# Patient Record
Sex: Male | Born: 1955 | Race: White | Hispanic: No | Marital: Single | Smoking: Never smoker
Health system: Southern US, Community
[De-identification: ages and names within clinical notes are randomized; demographics above are authoritative.]

## PROBLEM LIST (undated history)

## (undated) DIAGNOSIS — T8859XA Other complications of anesthesia, initial encounter: Secondary | ICD-10-CM

## (undated) DIAGNOSIS — T4145XA Adverse effect of unspecified anesthetic, initial encounter: Secondary | ICD-10-CM

## (undated) DIAGNOSIS — M25511 Pain in right shoulder: Secondary | ICD-10-CM

## (undated) DIAGNOSIS — M25512 Pain in left shoulder: Secondary | ICD-10-CM

## (undated) HISTORY — PX: WISDOM TOOTH EXTRACTION: SHX21

## (undated) HISTORY — PX: HERNIA REPAIR: SHX51

## (undated) HISTORY — PX: COLONOSCOPY: SHX174

---

## 2000-09-15 ENCOUNTER — Encounter: Payer: Self-pay | Admitting: *Deleted

## 2000-09-16 ENCOUNTER — Inpatient Hospital Stay (HOSPITAL_COMMUNITY): Admission: RE | Admit: 2000-09-16 | Discharge: 2000-09-17 | Payer: Self-pay | Admitting: *Deleted

## 2014-05-09 ENCOUNTER — Encounter (INDEPENDENT_AMBULATORY_CARE_PROVIDER_SITE_OTHER): Payer: No Typology Code available for payment source | Admitting: Ophthalmology

## 2014-05-09 DIAGNOSIS — H2513 Age-related nuclear cataract, bilateral: Secondary | ICD-10-CM

## 2014-05-09 DIAGNOSIS — H43813 Vitreous degeneration, bilateral: Secondary | ICD-10-CM

## 2014-05-09 DIAGNOSIS — H33011 Retinal detachment with single break, right eye: Secondary | ICD-10-CM

## 2014-05-21 ENCOUNTER — Ambulatory Visit (INDEPENDENT_AMBULATORY_CARE_PROVIDER_SITE_OTHER): Payer: No Typology Code available for payment source | Admitting: Ophthalmology

## 2014-05-21 DIAGNOSIS — H33301 Unspecified retinal break, right eye: Secondary | ICD-10-CM

## 2014-09-19 ENCOUNTER — Ambulatory Visit (INDEPENDENT_AMBULATORY_CARE_PROVIDER_SITE_OTHER): Payer: No Typology Code available for payment source | Admitting: Ophthalmology

## 2015-03-21 ENCOUNTER — Encounter (INDEPENDENT_AMBULATORY_CARE_PROVIDER_SITE_OTHER): Payer: 59 | Admitting: Ophthalmology

## 2015-03-21 DIAGNOSIS — H33012 Retinal detachment with single break, left eye: Secondary | ICD-10-CM

## 2015-03-21 DIAGNOSIS — H33301 Unspecified retinal break, right eye: Secondary | ICD-10-CM

## 2015-03-21 DIAGNOSIS — H43813 Vitreous degeneration, bilateral: Secondary | ICD-10-CM

## 2015-03-21 DIAGNOSIS — H4312 Vitreous hemorrhage, left eye: Secondary | ICD-10-CM | POA: Diagnosis not present

## 2015-04-01 ENCOUNTER — Ambulatory Visit (INDEPENDENT_AMBULATORY_CARE_PROVIDER_SITE_OTHER): Payer: 59 | Admitting: Ophthalmology

## 2015-04-01 DIAGNOSIS — H33302 Unspecified retinal break, left eye: Secondary | ICD-10-CM

## 2015-06-20 ENCOUNTER — Ambulatory Visit (INDEPENDENT_AMBULATORY_CARE_PROVIDER_SITE_OTHER): Payer: 59 | Admitting: Ophthalmology

## 2015-06-20 DIAGNOSIS — H33303 Unspecified retinal break, bilateral: Secondary | ICD-10-CM

## 2015-06-20 DIAGNOSIS — H43813 Vitreous degeneration, bilateral: Secondary | ICD-10-CM | POA: Diagnosis not present

## 2015-08-01 ENCOUNTER — Ambulatory Visit (INDEPENDENT_AMBULATORY_CARE_PROVIDER_SITE_OTHER): Payer: 59 | Admitting: Ophthalmology

## 2016-04-10 ENCOUNTER — Encounter
Admission: RE | Admit: 2016-04-10 | Discharge: 2016-04-10 | Disposition: A | Discharge status: Home / Self Care | Payer: BLUE CROSS/BLUE SHIELD | Source: Ambulatory Visit | Attending: Surgery | Admitting: Surgery

## 2016-04-10 HISTORY — DX: Adverse effect of unspecified anesthetic, initial encounter: T41.45XA

## 2016-04-10 HISTORY — DX: Other complications of anesthesia, initial encounter: T88.59XA

## 2016-04-10 HISTORY — DX: Pain in left shoulder: M25.512

## 2016-04-10 HISTORY — DX: Pain in right shoulder: M25.511

## 2016-04-10 NOTE — Patient Instructions (Signed)
  Your procedure is scheduled on: 04-17-16 Report to Same Day Surgery 2nd floor medical mall To find out your arrival time please call [phone] between 1PM - 3PM on 04-16-16   Remember: Instructions that are not followed completely may result in serious medical risk, up to and including death, or upon the discretion of your surgeon and anesthesiologist your surgery may need to be rescheduled.    _x___ 1. Do not eat food or drink liquids after midnight. No gum chewing or hard candies.     __x__ 2. No Alcohol for 24 hours before or after surgery.   __x__3. No Smoking for 24 prior to surgery.   ____  4. Bring all medications with you on the day of surgery if instructed.    __x__ 5. Notify your doctor if there is any change in your medical condition     (cold, fever, infections).     Do not wear jewelry, make-up, hairpins, clips or nail polish.  Do not wear lotions, powders, or perfumes. You may wear deodorant.  Do not shave 48 hours prior to surgery. Men may shave face and neck.  Do not bring valuables to the hospital.    [facility] is not responsible for any belongings or valuables.               Contacts, dentures or bridgework may not be worn into surgery.  Leave your suitcase in the car. After surgery it may be brought to your room.  For patients admitted to the hospital, discharge time is determined by your treatment team.   Patients discharged the day of surgery will not be allowed to drive home.    Please read over the following fact sheets that you were given:   [facility] Preparing for Surgery and or MRSA Information   ____ Take these medicines the morning of surgery with A SIP OF WATER:    1. NONE  2.  3.  4.  5.  6.  ____Fleets enema or Magnesium Citrate as directed.   ____ Use CHG Soap or sage wipes as directed on instruction sheet   ____ Use inhalers on the day of surgery and bring to hospital day of surgery  ____ Stop metformin 2 days prior to  surgery    ____ Take 1/2 of usual insulin dose the night before surgery and none on the morning of surgery.   ____ Stop aspirin or coumadin, or plavix  x__ Stop Anti-inflammatories such as Advil, Aleve, Ibuprofen, Motrin, Naproxen,          Naprosyn, Goodies powders or aspirin products. Ok to take Tylenol.   ____ Stop supplements until after surgery.    ____ Bring C-Pap to the hospital.

## 2016-04-17 ENCOUNTER — Encounter
Admission: RE | Disposition: A | Discharge status: Home / Self Care | Payer: Self-pay | Source: Ambulatory Visit | Attending: Surgery

## 2016-04-17 ENCOUNTER — Ambulatory Visit
Admission: RE | Admit: 2016-04-17 | Discharge: 2016-04-17 | Disposition: A | Discharge status: Home / Self Care | Payer: BLUE CROSS/BLUE SHIELD | Source: Ambulatory Visit | Attending: Surgery | Admitting: Surgery

## 2016-04-17 ENCOUNTER — Ambulatory Visit: Payer: BLUE CROSS/BLUE SHIELD | Admitting: Anesthesiology

## 2016-04-17 DIAGNOSIS — Z823 Family history of stroke: Secondary | ICD-10-CM | POA: Diagnosis present

## 2016-04-17 DIAGNOSIS — K429 Umbilical hernia without obstruction or gangrene: Secondary | ICD-10-CM | POA: Insufficient documentation

## 2016-04-17 DIAGNOSIS — D176 Benign lipomatous neoplasm of spermatic cord: Secondary | ICD-10-CM | POA: Diagnosis not present

## 2016-04-17 DIAGNOSIS — Z801 Family history of malignant neoplasm of trachea, bronchus and lung: Secondary | ICD-10-CM | POA: Insufficient documentation

## 2016-04-17 DIAGNOSIS — K4090 Unilateral inguinal hernia, without obstruction or gangrene, not specified as recurrent: Secondary | ICD-10-CM | POA: Insufficient documentation

## 2016-04-17 HISTORY — PX: INGUINAL HERNIA REPAIR: SHX194

## 2016-04-17 HISTORY — PX: UMBILICAL HERNIA REPAIR: SHX196

## 2016-04-17 SURGERY — REPAIR, HERNIA, INGUINAL, ADULT
Anesthesia: General | Laterality: Right | Wound class: Clean

## 2016-04-17 MED ORDER — LIDOCAINE HCL (CARDIAC) 20 MG/ML IV SOLN
INTRAVENOUS | Status: DC | PRN
  Administered 2016-04-17: 60 mg via INTRAVENOUS

## 2016-04-17 MED ORDER — SUCCINYLCHOLINE CHLORIDE 20 MG/ML IJ SOLN
INTRAMUSCULAR | Status: DC | PRN
  Administered 2016-04-17: 140 mg via INTRAVENOUS

## 2016-04-17 MED ORDER — DEXAMETHASONE SODIUM PHOSPHATE 10 MG/ML IJ SOLN
INTRAMUSCULAR | Status: DC | PRN
  Administered 2016-04-17: 5 mg via INTRAVENOUS

## 2016-04-17 MED ORDER — ONDANSETRON HCL 4 MG/2ML IJ SOLN
INTRAMUSCULAR | Status: AC
  Administered 2016-04-17: 4 mg via INTRAVENOUS
  Filled 2016-04-17: qty 2

## 2016-04-17 MED ORDER — CEFAZOLIN SODIUM-DEXTROSE 2-4 GM/100ML-% IV SOLN
2.0000 g | Freq: Once | INTRAVENOUS | Status: AC
  Administered 2016-04-17: 2 g via INTRAVENOUS

## 2016-04-17 MED ORDER — SUGAMMADEX SODIUM 200 MG/2ML IV SOLN
INTRAVENOUS | Status: DC | PRN
  Administered 2016-04-17: 200 mg via INTRAVENOUS

## 2016-04-17 MED ORDER — FENTANYL CITRATE (PF) 100 MCG/2ML IJ SOLN
25.0000 ug | INTRAMUSCULAR | Status: DC | PRN
  Administered 2016-04-17 (×4): 25 ug via INTRAVENOUS

## 2016-04-17 MED ORDER — HYDROCODONE-ACETAMINOPHEN 5-325 MG PO TABS: 1.0000 | ORAL_TABLET | ORAL | Status: DC | PRN

## 2016-04-17 MED ORDER — FAMOTIDINE 20 MG PO TABS
20.0000 mg | ORAL_TABLET | Freq: Once | ORAL | Status: AC
  Administered 2016-04-17: 20 mg via ORAL

## 2016-04-17 MED ORDER — MIDAZOLAM HCL 2 MG/2ML IJ SOLN
INTRAMUSCULAR | Status: DC | PRN
  Administered 2016-04-17: 2 mg via INTRAVENOUS

## 2016-04-17 MED ORDER — LACTATED RINGERS IV SOLN
INTRAVENOUS | Status: DC
  Administered 2016-04-17 (×2): via INTRAVENOUS

## 2016-04-17 MED ORDER — PROPOFOL 10 MG/ML IV BOLUS
INTRAVENOUS | Status: DC | PRN
  Administered 2016-04-17: 180 mg via INTRAVENOUS

## 2016-04-17 MED ORDER — CEFAZOLIN SODIUM-DEXTROSE 2-4 GM/100ML-% IV SOLN
INTRAVENOUS | Status: AC
  Administered 2016-04-17: 2 g via INTRAVENOUS
  Filled 2016-04-17: qty 100

## 2016-04-17 MED ORDER — ONDANSETRON HCL 4 MG/2ML IJ SOLN
INTRAMUSCULAR | Status: DC | PRN
  Administered 2016-04-17: 4 mg via INTRAVENOUS

## 2016-04-17 MED ORDER — GLYCOPYRROLATE 0.2 MG/ML IJ SOLN
INTRAMUSCULAR | Status: DC | PRN
  Administered 2016-04-17 (×2): 0.1 mg via INTRAVENOUS

## 2016-04-17 MED ORDER — BUPIVACAINE-EPINEPHRINE (PF) 0.5% -1:200000 IJ SOLN
INTRAMUSCULAR | Status: DC | PRN
  Administered 2016-04-17: 25 mL

## 2016-04-17 MED ORDER — FAMOTIDINE 20 MG PO TABS
ORAL_TABLET | ORAL | Status: AC
  Administered 2016-04-17: 20 mg via ORAL
  Filled 2016-04-17: qty 1

## 2016-04-17 MED ORDER — HYDROCODONE-ACETAMINOPHEN 5-325 MG PO TABS: 1.0000 | ORAL_TABLET | ORAL | 0 refills | Status: DC | PRN

## 2016-04-17 MED ORDER — ROCURONIUM BROMIDE 100 MG/10ML IV SOLN
INTRAVENOUS | Status: DC | PRN
  Administered 2016-04-17: 30 mg via INTRAVENOUS
  Administered 2016-04-17 (×3): 20 mg via INTRAVENOUS

## 2016-04-17 MED ORDER — FENTANYL CITRATE (PF) 100 MCG/2ML IJ SOLN
INTRAMUSCULAR | Status: DC | PRN
  Administered 2016-04-17: 100 ug via INTRAVENOUS
  Administered 2016-04-17: 50 ug via INTRAVENOUS

## 2016-04-17 MED ORDER — ONDANSETRON HCL 4 MG/2ML IJ SOLN
4.0000 mg | Freq: Once | INTRAMUSCULAR | Status: AC | PRN
  Administered 2016-04-17: 4 mg via INTRAVENOUS

## 2016-04-17 MED ORDER — FENTANYL CITRATE (PF) 100 MCG/2ML IJ SOLN
INTRAMUSCULAR | Status: AC
  Administered 2016-04-17: 25 ug via INTRAVENOUS
  Filled 2016-04-17: qty 2

## 2016-04-17 MED ORDER — BUPIVACAINE-EPINEPHRINE (PF) 0.5% -1:200000 IJ SOLN
INTRAMUSCULAR | Status: AC
  Filled 2016-04-17: qty 30

## 2016-04-17 SURGICAL SUPPLY — 34 items
ADH SKN CLS APL DERMABOND .7 (GAUZE/BANDAGES/DRESSINGS) ×2
BLADE CLIPPER SURG (BLADE) ×2 IMPLANT
BLADE SURG 15 STRL LF DISP TIS (BLADE) ×2 IMPLANT
BLADE SURG 15 STRL SS (BLADE) ×3
CANISTER SUCT 1200ML W/VALVE (MISCELLANEOUS) ×3 IMPLANT
CHLORAPREP W/TINT 26ML (MISCELLANEOUS) ×3 IMPLANT
DERMABOND ADVANCED (GAUZE/BANDAGES/DRESSINGS) ×1
DERMABOND ADVANCED .7 DNX12 (GAUZE/BANDAGES/DRESSINGS) ×1 IMPLANT
DRAIN PENROSE 5/8X18 LTX STRL (WOUND CARE) ×3 IMPLANT
DRAPE LAPAROTOMY 100X77 ABD (DRAPES) ×2 IMPLANT
DRAPE LAPAROTOMY 77X122 PED (DRAPES) ×3 IMPLANT
ELECT REM PT RETURN 9FT ADLT (ELECTROSURGICAL) ×3
ELECTRODE REM PT RTRN 9FT ADLT (ELECTROSURGICAL) ×2 IMPLANT
GLOVE BIO SURGEON STRL SZ7.5 (GLOVE) ×3 IMPLANT
GOWN STRL REUS W/ TWL LRG LVL3 (GOWN DISPOSABLE) ×6 IMPLANT
GOWN STRL REUS W/TWL LRG LVL3 (GOWN DISPOSABLE) ×9
KIT RM TURNOVER STRD PROC AR (KITS) ×3 IMPLANT
LABEL OR SOLS (LABEL) IMPLANT
LIQUID BAND (GAUZE/BANDAGES/DRESSINGS) ×1 IMPLANT
MESH SYNTHETIC 4X6 SOFT BARD (Mesh General) ×2 IMPLANT
MESH SYNTHETIC SOFT BARD 4X6 (Mesh General) ×1 IMPLANT
NDL HYPO 25X1 1.5 SAFETY (NEEDLE) ×1 IMPLANT
NEEDLE HYPO 25X1 1.5 SAFETY (NEEDLE) ×3 IMPLANT
NS IRRIG 500ML POUR BTL (IV SOLUTION) ×3 IMPLANT
PACK BASIN MINOR ARMC (MISCELLANEOUS) ×3 IMPLANT
SUT CHROMIC 3 0 SH 27 (SUTURE) ×2 IMPLANT
SUT CHROMIC 4 0 RB 1X27 (SUTURE) ×1 IMPLANT
SUT MNCRL AB 4-0 PS2 18 (SUTURE) ×1 IMPLANT
SUT MNCRL+ 5-0 UNDYED PC-3 (SUTURE) ×2 IMPLANT
SUT MONOCRYL 5-0 (SUTURE) ×1
SUT SURGILON 0 30 BLK (SUTURE) ×9 IMPLANT
SUT VIC AB 4-0 SH 27 (SUTURE) ×3
SUT VIC AB 4-0 SH 27XANBCTRL (SUTURE) ×3 IMPLANT
SYRINGE 10CC LL (SYRINGE) ×3 IMPLANT

## 2016-04-17 NOTE — Transfer of Care (Signed)
Immediate Anesthesia Transfer of Care Note  Patient: Robert Simon.  Procedure(s) Performed: Procedure(s): HERNIA REPAIR INGUINAL ADULT (Right) HERNIA REPAIR UMBILICAL ADULT (N/A)  Patient Location: PACU  Anesthesia Type:General  Level of Consciousness: sedated  Airway & Oxygen Therapy: Patient Spontanous Breathing and Patient connected to face mask oxygen  Post-op Assessment: Report given to RN and Post -op Vital signs reviewed and stable  Post vital signs: Reviewed and stable  Last Vitals:  Vitals:   04/17/16 1229 04/17/16 1642  BP: 126/85 135/78  Pulse: 98 81  Resp: 14 12  Temp: 37 C [id number] C    Complications: No apparent anesthesia complications

## 2016-04-17 NOTE — Op Note (Signed)
OPERATIVE REPORT  PREOPERATIVE DIAGNOSIS: right inguinal hernia, umbilical hernia  POSTOPERATIVE DIAGNOSIS:right  inguinal hernia, umbilical hernia  PROCEDURE:  right inguinal hernia repair, umbilical hernia repair  ANESTHESIA:  General  SURGEON:  Rochel Brome M.D.  INDICATIONS: He has had recent development of swelling in the right groin with moderate associated pain. He also has a long-standing small umbilical hernia. The umbilical hernia was demonstrated on physical exam and also a large right inguinal scrotal hernia was demonstrated on exam. Surgery was recommended for definitive treatment.  With the patient on the operating table in the supine position the abdomen was prepared with clippers and with ChloraPrep and draped in a sterile manner. A transversely oriented right suprapubic incision was made and carried down through subcutaneous tissues. Electrocautery was used for hemostasis. The Scarpa's fascia was incised. The external oblique aponeurosis was incised along the course of its fibers to open the external ring and expose the inguinal cord structures. The cord structures were mobilized. A Penrose drain was passed around the cord structures for traction. Cremaster fibers were separated to expose an indirect hernia sac. The sac was dissected free from surrounding structures and was approximately 5 inches in length. The sac was opened. Its continuity with the peritoneal cavity was demonstrated. A high ligation of the sac was done with a 0 Surgilon suture ligature. The sac was excised and the stump was allowed to retract. The sac was not submitted for pathology. There was also a cord lipoma which was dissected free from surrounding structures and was approximately 4 cm in length. This was suture ligated with 4-0 Vicryl and amputated and was not submitted for pathology. There was some mild weakness of the floor of the inguinal canal. The repair was carried out with 0 Surgilon sutures beginning at  the pubic tubercle suturing the conjoined tendon to the shelving edge of the inguinal ligament. The last stitch led to satisfactory narrowing of the internal ring. Bard soft mesh was cut to create an oval shape and was placed over the repair. This was sutured to the repair with interrupted 0 Surgilon sutures and also sutured medially to the deep fascia and on both sides of the internal ring. Next after seeing hemostasis was intact the cord structures were replaced along the floor of the inguinal canal. The cut edges of the external oblique aponeurosis were closed with a running 4-0 Vicryl suture to re-create the external ring. The deep fascia superior and lateral to the repair site was infiltrated with half percent Sensorcaine with epinephrine. Subcutaneous tissues were also infiltrated. The Scarpa's fascia was closed with interrupted 4-0 Vicryl sutures. The skin was closed with running 5-0 Monocryl subcuticular suture.  The patient was in satisfactory condition. Next the umbilical hernia was repaired beginning with a supraumbilical transversely oriented curvilinear incision. An umbilical hernia sac was dissected free from surrounding structures and was peeled away from the skin of the umbilicus. This was carried down to the fascial ring defect which was approximately 7 mm. There was incarcerated fat within the hernia sac the fascial defect was enlarged on the left side to approximately 1 cm and this allowed reduction of the hernia. There was satisfactory thickness and strength of the fascial ring defect. The umbilical hernia was repaired with a transversely oriented suture line of interrupted 0 Surgilon figure-of-eight sutures. The deep fascia and subcutaneous tissues were infiltrated with half percent Sensorcaine with epinephrine. A 5-0 Monocryl pursestring suture was placed to obliterate dead space. The skin was closed with running  5-0 Monocryl subcuticular suture.  Both wounds were treated with Dermabond and  allowed to dry.  The patient appeared to be in satisfactory condition and was prepared for transfer to the recovery room.  Rochel Brome M.D.

## 2016-04-17 NOTE — Anesthesia Preprocedure Evaluation (Signed)
Anesthesia Evaluation  Patient identified by MRN, date of birth, ID band Patient awake    Reviewed: Allergy & Precautions, H&P , NPO status , Patient's Chart, lab work & pertinent test results, reviewed documented beta blocker date and time   History of Anesthesia Complications (+) PROLONGED EMERGENCE and history of anesthetic complications  Airway Mallampati: II  TM Distance: >3 FB Neck ROM: full    Dental  (+) Teeth Intact   Pulmonary neg pulmonary ROS,    Pulmonary exam normal        Cardiovascular Exercise Tolerance: Good negative cardio ROS Normal cardiovascular exam Rhythm:regular Rate:Normal     Neuro/Psych negative neurological ROS  negative psych ROS   GI/Hepatic negative GI ROS, Neg liver ROS,   Endo/Other  negative endocrine ROS  Renal/GU negative Renal ROS  negative genitourinary   Musculoskeletal   Abdominal   Peds  Hematology negative hematology ROS (+)   Anesthesia Other Findings   Reproductive/Obstetrics negative OB ROS                             Anesthesia Physical Anesthesia Plan  ASA: II  Anesthesia Plan: General LMA   Post-op Pain Management:    Induction:   Airway Management Planned:   Additional Equipment:   Intra-op Plan:   Post-operative Plan:   Informed Consent: I have reviewed the patients History and Physical, chart, labs and discussed the procedure including the risks, benefits and alternatives for the proposed anesthesia with the patient or authorized representative who has indicated his/her understanding and acceptance.     Plan Discussed with: CRNA  Anesthesia Plan Comments: (BPs are reportedly more accurate if on left side. )        Anesthesia Quick Evaluation

## 2016-04-17 NOTE — Discharge Instructions (Addendum)
Take Tylenol or Norco if needed for pain. ° °Should not drive or do anything dangerous when taking Norco. ° °May shower and blot dry. ° °Avoid straining and heavy lifting. ° °AMBULATORY SURGERY  °DISCHARGE INSTRUCTIONS ° ° °1) The drugs that you were given will stay in your system until tomorrow so for the next 24 hours you should not: ° °A) Drive an automobile °B) Make any legal decisions °C) Drink any alcoholic beverage ° ° °2) You may resume regular meals tomorrow.  Today it is better to start with liquids and gradually work up to solid foods. ° °You may eat anything you prefer, but it is better to start with liquids, then soup and crackers, and gradually work up to solid foods. ° ° °3) Please notify your doctor immediately if you have any unusual bleeding, trouble breathing, redness and pain at the surgery site, drainage, fever, or pain not relieved by medication. ° °4) Additional Instructions: ° ° °Please contact your physician with any problems or Same Day Surgery at [phone], Monday through Friday 6 am to 4 pm, or [facility] number at [phone]. °

## 2016-04-17 NOTE — Progress Notes (Signed)
Patient complained of some left calf pain, like a cramp. MD notified upon arrival to [facility] op. MD examined. Patient wearing knee high ted hose. Walked around unit multiple times.

## 2016-04-17 NOTE — H&P (Signed)
  He reports no change in condition since the day of the office visit.  Lab work noted  I discussed the plan for right inguinal hernia repair and umbilical hernia repair. The right side was marked YES.

## 2016-04-17 NOTE — Anesthesia Procedure Notes (Addendum)
Procedure Name: Intubation Date/Time: 04/17/2016 2:00 PM Performed by: Nelda Marseille Pre-anesthesia Checklist: Patient identified, Emergency Drugs available, Suction available, Patient being monitored and Timeout performed Patient Re-evaluated:Patient Re-evaluated prior to inductionOxygen Delivery Method: Circle system utilized Preoxygenation: Pre-oxygenation with 100% oxygen Intubation Type: IV induction Laryngoscope Size: Mac and 3 Grade View: Grade III Tube type: Oral Tube size: 7.0 mm Number of attempts: 1 Airway Equipment and Method: Stylet Placement Confirmation: ETT inserted through vocal cords under direct vision,  positive ETCO2,  CO2 detector and breath sounds checked- equal and bilateral Secured at: 23 cm Tube secured with: Tape Dental Injury: Teeth and Oropharynx as per pre-operative assessment

## 2016-04-20 NOTE — Anesthesia Postprocedure Evaluation (Signed)
Anesthesia Post Note  Patient: Frisco Staiger.  Procedure(s) Performed: Procedure(s) (LRB): HERNIA REPAIR INGUINAL ADULT (Right) HERNIA REPAIR UMBILICAL ADULT (N/A)  Patient location during evaluation: PACU Anesthesia Type: General Level of consciousness: awake and alert Pain management: pain level controlled Vital Signs Assessment: post-procedure vital signs reviewed and stable Respiratory status: spontaneous breathing, nonlabored ventilation, respiratory function stable and patient connected to nasal cannula oxygen Cardiovascular status: blood pressure returned to baseline and stable Postop Assessment: no signs of nausea or vomiting Anesthetic complications: no    Last Vitals:  Vitals:   04/17/16 1753 04/17/16 1833  BP: 125/70 (!) 143/81  Pulse: 70 75  Resp: 16 18  Temp: (!) 36.1 C     Last Pain:  Vitals:   04/20/16 0835  TempSrc:   PainSc: 0-No pain                 Molli Barrows

## 2016-04-21 ENCOUNTER — Encounter: Payer: Self-pay | Admitting: Surgery

## 2016-06-22 ENCOUNTER — Ambulatory Visit (INDEPENDENT_AMBULATORY_CARE_PROVIDER_SITE_OTHER): Payer: BLUE CROSS/BLUE SHIELD | Admitting: Ophthalmology

## 2016-06-22 DIAGNOSIS — H43813 Vitreous degeneration, bilateral: Secondary | ICD-10-CM

## 2016-06-22 DIAGNOSIS — H33303 Unspecified retinal break, bilateral: Secondary | ICD-10-CM

## 2017-04-01 ENCOUNTER — Encounter (INDEPENDENT_AMBULATORY_CARE_PROVIDER_SITE_OTHER): Payer: BLUE CROSS/BLUE SHIELD | Admitting: Ophthalmology

## 2017-04-01 DIAGNOSIS — H43813 Vitreous degeneration, bilateral: Secondary | ICD-10-CM | POA: Diagnosis not present

## 2017-04-01 DIAGNOSIS — H33303 Unspecified retinal break, bilateral: Secondary | ICD-10-CM

## 2017-06-23 ENCOUNTER — Ambulatory Visit (INDEPENDENT_AMBULATORY_CARE_PROVIDER_SITE_OTHER): Payer: BLUE CROSS/BLUE SHIELD | Admitting: Ophthalmology

## 2018-04-01 ENCOUNTER — Encounter (INDEPENDENT_AMBULATORY_CARE_PROVIDER_SITE_OTHER): Payer: BLUE CROSS/BLUE SHIELD | Admitting: Ophthalmology

## 2018-09-26 ENCOUNTER — Telehealth: Payer: Self-pay

## 2018-09-27 NOTE — Telephone Encounter (Signed)
Left message with patient informing them that the lung cancer screening that they are signed up for on Monday, March 30th is cancelled because of the CO-VID19. Screening will be rescheduled for a later date.

## 2018-10-03 ENCOUNTER — Ambulatory Visit: Payer: BLUE CROSS/BLUE SHIELD

## 2018-10-20 ENCOUNTER — Ambulatory Visit: Payer: BLUE CROSS/BLUE SHIELD

## 2019-01-16 ENCOUNTER — Other Ambulatory Visit: Payer: Self-pay | Admitting: Pulmonary Disease

## 2019-01-16 ENCOUNTER — Other Ambulatory Visit: Payer: Self-pay

## 2019-01-16 DIAGNOSIS — Z87891 Personal history of nicotine dependence: Secondary | ICD-10-CM

## 2019-01-16 DIAGNOSIS — Z122 Encounter for screening for malignant neoplasm of respiratory organs: Secondary | ICD-10-CM

## 2019-01-16 NOTE — Progress Notes (Signed)
Shared Decision-Making Visit for Lung Cancer Screening [phone] )  Lung Cancer Screening Criteria 23-2-years of age 65+ pack year smoking history No Recent History of coughing up blood   No Unexplained weight loss of > 15 pounds in the last 6 months. No Prior History Lung / other cancer  (Diagnosis within the last 5 years already requiring surveillance chest CT Scans). Pt is a current smoker, or former smoker who has quit within the last 15 years.  63 year old male former smoker.  34-pack-year smoking history.  Quit 2007.  Patient agrees to proceed forward with lung cancer screening program.  This patient meets the criterial noted above and is asymptomatic for any signs or symptoms of lung cancer.  The Shared Decision-Making Visit discussion included risks and benefits of screening, potential for follow up diagnostic testing for abnormal scans, potential for false positive tests, over diagnosis, and discussion about total radiation exposure. Patient stated willingness to undergo diagnostics and treatment as needed. Current smokers were counseled on smoking cessation as the single most powerful action they can take to decrease their risk of lung cancer, pulmonary disease, heart disease and stroke. They were given a resource card with information on receiving free nicotine replacement therapy , and information about free smoking cessation classes.  Pt understands that this scan is being paid for by a grant obtained by the [facility]. We discussed  that there is no guarantee of additional grant money from year to year as these grants are not guaranteed to programs on an annual basis.They have to be applied for and they are awarded based on county need.   Blue Card: 8

## 2019-01-31 ENCOUNTER — Ambulatory Visit
Admission: RE | Admit: 2019-01-31 | Discharge: 2019-01-31 | Disposition: A | Discharge status: Home / Self Care | Payer: No Typology Code available for payment source | Source: Ambulatory Visit | Attending: Acute Care | Admitting: Acute Care

## 2019-01-31 DIAGNOSIS — Z122 Encounter for screening for malignant neoplasm of respiratory organs: Secondary | ICD-10-CM

## 2019-01-31 DIAGNOSIS — Z87891 Personal history of nicotine dependence: Secondary | ICD-10-CM

## 2019-02-09 ENCOUNTER — Telehealth (HOSPITAL_COMMUNITY): Payer: Self-pay | Admitting: *Deleted

## 2019-02-09 NOTE — Telephone Encounter (Signed)
Patient called requesting lung cancer screening results. Attempted to call patient back. No one answered phone. Left voicemail for patient to call me back.

## 2019-03-27 ENCOUNTER — Other Ambulatory Visit: Payer: Self-pay | Admitting: *Deleted

## 2019-03-27 ENCOUNTER — Other Ambulatory Visit: Payer: Self-pay

## 2019-03-27 VITALS — Temp 96.8°F

## 2019-03-27 DIAGNOSIS — Z125 Encounter for screening for malignant neoplasm of prostate: Secondary | ICD-10-CM

## 2019-03-27 NOTE — Progress Notes (Signed)
Patient: Khai Sigler.           Date of Birth: 04-10-1956           MRN: [id number] Visit Date: 03/27/2019 PCP: Tempie Hoist, MD  Prostate Cancer Screening Date of last physical exam: (over a year) Date of last rectal exam: (couple of years) Have you ever had any of the following?: (Father prostate cancer) Have you ever had or been told you have an allergy to latex products?: No Are you currently taking any natural prostate preparations?: No Are you currently experiencing any urinary symptoms?: No  Prostate Exam Exam not completed.  Patient's History There are no active problems to display for this patient.  Past Medical History:  Diagnosis Date  . Complication of anesthesia    PT STATES ANESTHESIA AS A CHILD MADE HIM VERY HYPER-PT VERY CONCERNED HE WILL GET TOO MUCH ANESTHESIA-PT STATES HE ONLY NEEDS HALF OF WHAT A NORMAL PERSON WOULD GET-BE VERY CAUTIOUS WITH HIS SHOULDERS-PT CONCERNED OF PLACEMENT ON OR TABLE DUE TO HIS BAD SHOULDERS (RIGHT WORSE THAN THE LEFT)  . Shoulder pain, bilateral     No family history on file.  Social History   Occupational History  . Not on file  Tobacco Use  . Smoking status: Never Smoker  . Smokeless tobacco: Never Used  Substance and Sexual Activity  . Alcohol use: Yes    Comment: RARE  . Drug use: No  . Sexual activity: Yes

## 2019-03-28 LAB — PSA: Prostate Specific Ag, Serum: 0.6 ng/mL (ref 0.0–4.0)

## 2019-06-03 ENCOUNTER — Emergency Department
Admission: EM | Admit: 2019-06-03 | Discharge: 2019-06-03 | Disposition: A | Discharge status: Home / Self Care | Payer: Medicare Other | Attending: Emergency Medicine | Admitting: Emergency Medicine

## 2019-06-03 ENCOUNTER — Encounter: Payer: Self-pay | Admitting: Emergency Medicine

## 2019-06-03 ENCOUNTER — Other Ambulatory Visit: Payer: Self-pay

## 2019-06-03 ENCOUNTER — Emergency Department: Payer: Medicare Other

## 2019-06-03 DIAGNOSIS — W208XXA Other cause of strike by thrown, projected or falling object, initial encounter: Secondary | ICD-10-CM | POA: Insufficient documentation

## 2019-06-03 DIAGNOSIS — Y9289 Other specified places as the place of occurrence of the external cause: Secondary | ICD-10-CM | POA: Insufficient documentation

## 2019-06-03 DIAGNOSIS — S0101XA Laceration without foreign body of scalp, initial encounter: Secondary | ICD-10-CM | POA: Diagnosis present

## 2019-06-03 DIAGNOSIS — Y999 Unspecified external cause status: Secondary | ICD-10-CM | POA: Insufficient documentation

## 2019-06-03 DIAGNOSIS — Z79899 Other long term (current) drug therapy: Secondary | ICD-10-CM | POA: Diagnosis not present

## 2019-06-03 DIAGNOSIS — Y939 Activity, unspecified: Secondary | ICD-10-CM | POA: Diagnosis not present

## 2019-06-03 DIAGNOSIS — S0990XA Unspecified injury of head, initial encounter: Secondary | ICD-10-CM

## 2019-06-03 MED ORDER — TRAMADOL HCL 50 MG PO TABS: 50.0000 mg | ORAL_TABLET | Freq: Four times a day (QID) | ORAL | 0 refills | Status: DC | PRN

## 2019-06-03 MED ORDER — LIDOCAINE HCL (PF) 1 % IJ SOLN
5.0000 mL | Freq: Once | INTRAMUSCULAR | Status: DC
  Filled 2019-06-03: qty 5

## 2019-06-03 MED ORDER — BACITRACIN-NEOMYCIN-POLYMYXIN 400-5-5000 EX OINT: 1.0000 "application " | TOPICAL_OINTMENT | Freq: Two times a day (BID) | CUTANEOUS | 0 refills | Status: DC

## 2019-06-03 MED ORDER — LIDOCAINE-EPINEPHRINE-TETRACAINE (LET) TOPICAL GEL
3.0000 mL | Freq: Once | TOPICAL | Status: AC
  Administered 2019-06-03: 3 mL via TOPICAL
  Filled 2019-06-03: qty 3

## 2019-06-03 NOTE — ED Notes (Signed)
Pt states limb fell and hit him on the head. Pt denies LOC, vomiting. Pt with 1 inch gash on top of head, no active bleeding at present.

## 2019-06-03 NOTE — ED Triage Notes (Signed)
FIRST NURSE NOTE:  Pt from [facility] urgent care,  Hit head on tree limp, laceration noted, also c/o lightheadedness, nausea.  Pt is alert and oriented on arrival pt provided wheelchair.

## 2019-06-03 NOTE — ED Triage Notes (Signed)
States was limb from tree came down and hit top of his head about 1 pm. No LOC. Thin linear lac top of head with no bleeding at present. Does not take blood thinners.

## 2019-06-03 NOTE — ED Notes (Signed)
Pt concerned about getting CT scan soon, CT called and told this RN that there are 3 pts in front of him. Pt informed.

## 2019-06-03 NOTE — ED Provider Notes (Signed)
[facility] Emergency Department Provider Note  ____________________________________________  Time seen: Approximately 4:14 PM  I have reviewed the triage vital signs and the nursing notes.   HISTORY  Chief Complaint Laceration    HPI Robert Hegarty. is a 63 y.o. male that presents to the emergency department for evaluation of head injury.  Patient was outside when a branch fell onto his head.  He is unsure how big the branch was because he immediately went inside.  He did not lose consciousness.  He is not on any blood thinners.  He has a mild frontal headache.  No dizziness.  He went to urgent care prior to coming to the emergency department and was recommended to come here for head CT. No neck pain.  Past Medical History:  Diagnosis Date  . Complication of anesthesia    PT STATES ANESTHESIA AS A CHILD MADE HIM VERY HYPER-PT VERY CONCERNED HE WILL GET TOO MUCH ANESTHESIA-PT STATES HE ONLY NEEDS HALF OF WHAT A NORMAL PERSON WOULD GET-BE VERY CAUTIOUS WITH HIS SHOULDERS-PT CONCERNED OF PLACEMENT ON OR TABLE DUE TO HIS BAD SHOULDERS (RIGHT WORSE THAN THE LEFT)  . Shoulder pain, bilateral     There are no active problems to display for this patient.   Past Surgical History:  Procedure Laterality Date  . COLONOSCOPY    . HERNIA REPAIR     AGE 1  . INGUINAL HERNIA REPAIR Right 04/17/2016   Procedure: HERNIA REPAIR INGUINAL ADULT;  Surgeon: Leonie Green, MD;  Location: [facility] ORS;  Service: General;  Laterality: Right;  . UMBILICAL HERNIA REPAIR N/A 04/17/2016   Procedure: HERNIA REPAIR UMBILICAL ADULT;  Surgeon: Leonie Green, MD;  Location: [facility] ORS;  Service: General;  Laterality: N/A;  . WISDOM TOOTH EXTRACTION      Prior to Admission medications   Medication Sig Start Date End Date Taking? Authorizing Provider  HYDROcodone-acetaminophen (NORCO) 5-325 MG tablet Take 1-2 tablets by mouth every 4 (four) hours as needed for moderate pain.  04/17/16   Leonie Green, MD  neomycin-bacitracin-polymyxin (NEOSPORIN) ointment Apply 1 application topically every 12 (twelve) hours. 06/03/19   Laban Emperor, PA-C  traMADol (ULTRAM) 50 MG tablet Take 1 tablet (50 mg total) by mouth every 6 (six) hours as needed. 06/03/19 06/02/20  Laban Emperor, PA-C    Allergies Patient has no known allergies.  No family history on file.  Social History Social History   Tobacco Use  . Smoking status: Never Smoker  . Smokeless tobacco: Never Used  Substance Use Topics  . Alcohol use: Yes    Comment: RARE  . Drug use: No     Review of Systems  Gastrointestinal:  No nausea, no vomiting.  Musculoskeletal: Negative for musculoskeletal pain. Skin: Negative for rash, ecchymosis.  Positive for laceration. Neurological: Positive for mild headache.   ____________________________________________   PHYSICAL EXAM:  VITAL SIGNS: ED Triage Vitals  Enc Vitals Group     BP 06/03/19 1535 (!) 167/92     Pulse Rate 06/03/19 1535 (!) 103     Resp 06/03/19 1535 20     Temp 06/03/19 1535 98.7 F (37.1 C)     Temp Source 06/03/19 1535 Oral     SpO2 06/03/19 1535 97 %     Weight 06/03/19 1536 240 lb (108.9 kg)     Height 06/03/19 1536 5\' 9"  (1.753 m)     Head Circumference --      Peak Flow --  Pain Score 06/03/19 1536 3     Pain Loc --      Pain Edu? --      Excl. in [place]? --      Constitutional: Alert and oriented. Well appearing and in no acute distress. Eyes: Conjunctivae are normal. PERRL. EOMI. Head: 5 cm L-shaped laceration to top of scalp. ENT:      Ears:      Nose: No congestion/rhinnorhea.      Mouth/Throat: Mucous membranes are moist.  Neck: No stridor.  Cardiovascular: Normal rate, regular rhythm.  Good peripheral circulation. Respiratory: Normal respiratory effort without tachypnea or retractions. Lungs CTAB. Good air entry to the bases with no decreased or absent breath sounds. Musculoskeletal: Full range of  motion to all extremities. No gross deformities appreciated. Neurologic:  Normal speech and language. No gross focal neurologic deficits are appreciated.  Skin:  Skin is warm, dry and intact. No rash noted. Psychiatric: Mood and affect are normal. Speech and behavior are normal. Patient exhibits appropriate insight and judgement.   ____________________________________________   LABS (all labs ordered are listed, but only abnormal results are displayed)  Labs Reviewed - No data to display ____________________________________________  EKG   ____________________________________________  RADIOLOGY Robinette Haines, personally viewed and evaluated these images (plain radiographs) as part of my medical decision making, as well as reviewing the written report by the radiologist.  Ct Head Wo Contrast  Result Date: 06/03/2019 CLINICAL DATA:  Patient with injury to the head. EXAM: CT HEAD WITHOUT CONTRAST TECHNIQUE: Contiguous axial images were obtained from the base of the skull through the vertex without intravenous contrast. COMPARISON:  None. FINDINGS: Brain: Ventricles and sulci are appropriate for patient's age. No evidence for acute cortically based infarct, intracranial hemorrhage, mass lesion or mass-effect. 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 Vascular: Unremarkable Skull: Intact. Sinuses/Orbits: Paranasal sinuses are well aerated. Mastoid air cells are unremarkable. Other: None. IMPRESSION: No acute intracranial process. Electronically Signed   By: Lovey Newcomer M.D.   On: 06/03/2019 17:11    ____________________________________________    PROCEDURES  Procedure(s) performed:    Procedures  LACERATION REPAIR Performed by: Laban Emperor  Consent: Verbal consent obtained.  Consent given by: patient  Prepped and Draped in normal sterile fashion  Wound explored: No foreign bodies   Laceration Location: scalp  Laceration Length: 5 cm  Anesthesia:  None  Local anesthetic: lidocaine 1% without epinephrine and LET  Anesthetic total: 3 ml  Irrigation method: syringe  Amount of cleaning: 548ml normal saline  Skin closure: staples  Number of staples: 5  Patient tolerance: Patient tolerated the procedure well with no immediate complications.  Medications  lidocaine (PF) (XYLOCAINE) 1 % injection 5 mL (has no administration in time range)  lidocaine-EPINEPHrine-tetracaine (LET) topical gel (3 mLs Topical Given 06/03/19 1729)     ____________________________________________   INITIAL IMPRESSION / ASSESSMENT AND PLAN / ED COURSE  Pertinent labs & imaging results that were available during my care of the patient were reviewed by me and considered in my medical decision making (see chart for details).  Review of the [place] CSRS was performed in accordance of the [place] prior to dispensing any controlled drugs.   Patient's diagnosis is consistent with scalp laceration.  CT is negative for acute abnormalities.  Laceration was repaired with staples.  Patient will be discharged home with prescriptions for neosporin and a short course of tramadol. Patient is to follow up with primary care as directed. Patient is given ED precautions to  return to the ED for any worsening or new symptoms.  Robert Flatten. was evaluated in Emergency Department on 06/03/2019 for the symptoms described in the history of present illness. He was evaluated in the context of the global COVID-19 pandemic, which necessitated consideration that the patient might be at risk for infection with the SARS-CoV-2 virus that causes COVID-19. Institutional protocols and algorithms that pertain to the evaluation of patients at risk for COVID-19 are in a state of rapid change based on information released by regulatory bodies including the CDC and federal and state organizations. These policies and algorithms were followed during the patient's care in the  ED.   ____________________________________________  FINAL CLINICAL IMPRESSION(S) / ED DIAGNOSES  Final diagnoses:  Injury of head, initial encounter  Laceration of scalp without foreign body, initial encounter      NEW MEDICATIONS STARTED DURING THIS VISIT:  ED Discharge Orders         Ordered    traMADol (ULTRAM) 50 MG tablet  Every 6 hours PRN     06/03/19 1801    neomycin-bacitracin-polymyxin (NEOSPORIN) ointment  Every 12 hours     06/03/19 1801              This chart was dictated using voice recognition software/Dragon. Despite best efforts to proofread, errors can occur which can change the meaning. Any change was purely unintentional.    Laban Emperor, PA-C 06/03/19 2134    Harvest Dark, MD 06/03/19 2244

## 2019-06-04 ENCOUNTER — Telehealth: Payer: Self-pay | Admitting: Emergency Medicine

## 2019-06-04 NOTE — Telephone Encounter (Signed)
Pt called stating that he could not find his discharge paperwork from yesterday. This RN called and spoke with pt. RN informed pt that we could print out a copy of paperwork and leave at the front desk for him to come pick up. Pt also inquired about where his medications were sent. Pt informed that according to our records, both RXs were sent to [facility] on [address].

## 2019-06-14 ENCOUNTER — Encounter (INDEPENDENT_AMBULATORY_CARE_PROVIDER_SITE_OTHER): Payer: Medicare Other | Admitting: Ophthalmology

## 2019-06-14 ENCOUNTER — Other Ambulatory Visit: Payer: Self-pay

## 2019-06-14 DIAGNOSIS — H43813 Vitreous degeneration, bilateral: Secondary | ICD-10-CM | POA: Diagnosis not present

## 2019-06-14 DIAGNOSIS — H33303 Unspecified retinal break, bilateral: Secondary | ICD-10-CM

## 2019-06-14 DIAGNOSIS — H2513 Age-related nuclear cataract, bilateral: Secondary | ICD-10-CM | POA: Diagnosis not present

## 2019-07-03 ENCOUNTER — Ambulatory Visit: Payer: Medicare Other | Attending: Internal Medicine

## 2019-07-03 ENCOUNTER — Other Ambulatory Visit: Payer: Self-pay

## 2019-07-03 DIAGNOSIS — Z20822 Contact with and (suspected) exposure to covid-19: Secondary | ICD-10-CM

## 2019-07-05 LAB — NOVEL CORONAVIRUS, NAA: SARS-CoV-2, NAA: NOT DETECTED

## 2019-07-30 ENCOUNTER — Ambulatory Visit: Payer: Medicare Other | Attending: Internal Medicine

## 2019-07-30 DIAGNOSIS — Z23 Encounter for immunization: Secondary | ICD-10-CM | POA: Insufficient documentation

## 2019-07-30 NOTE — Progress Notes (Signed)
   [id number] Vaccination Clinic  Name:  Robert Simon.    MRN: [id number] DOB: Jun 30, 1956  07/30/2019  [name] was observed post Covid-19 immunization for 15 minutes without incidence. He was provided with Vaccine Information Sheet and instruction to access the V-Safe system.   [name] was instructed to call 911 with any severe reactions post vaccine: Marland Kitchen Difficulty breathing  . Swelling of your face and throat  . A fast heartbeat  . A bad rash all over your body  . Dizziness and weakness    Immunizations Administered    Name Date Dose VIS Date Route   Pfizer COVID-19 Vaccine 07/30/2019 11:05 AM 0.3 mL 06/16/2019 Intramuscular   Manufacturer: [place]   Lot: [id number]   [place]: [id number]

## 2019-08-21 ENCOUNTER — Ambulatory Visit: Payer: Medicare Other | Attending: Internal Medicine

## 2019-08-21 DIAGNOSIS — Z23 Encounter for immunization: Secondary | ICD-10-CM | POA: Insufficient documentation

## 2019-08-21 NOTE — Progress Notes (Signed)
   [id number] Vaccination Clinic  Name:  Robert Simon.    MRN: [id number] DOB: 1956/04/28  08/21/2019  Mr. Robert Simon was observed post Covid-19 immunization for 30 minutes based on pre-vaccination screening without incidence. He was provided with Vaccine Information Sheet and instruction to access the V-Safe system.   Robert Simon was instructed to call 911 with any severe reactions post vaccine: Marland Kitchen Difficulty breathing  . Swelling of your face and throat  . A fast heartbeat  . A bad rash all over your body  . Dizziness and weakness    Immunizations Administered    Name Date Dose VIS Date Route   Pfizer COVID-19 Vaccine 08/21/2019  1:08 PM 0.3 mL 06/16/2019 Intramuscular   Manufacturer: [place]   Lot: [id number]   [place]: [id number]

## 2019-10-23 ENCOUNTER — Encounter (INDEPENDENT_AMBULATORY_CARE_PROVIDER_SITE_OTHER): Payer: Medicare Other | Admitting: Ophthalmology

## 2019-10-23 ENCOUNTER — Other Ambulatory Visit: Payer: Self-pay

## 2019-10-23 DIAGNOSIS — H5319 Other subjective visual disturbances: Secondary | ICD-10-CM | POA: Diagnosis not present

## 2019-10-23 DIAGNOSIS — H33303 Unspecified retinal break, bilateral: Secondary | ICD-10-CM

## 2019-10-23 DIAGNOSIS — H43813 Vitreous degeneration, bilateral: Secondary | ICD-10-CM | POA: Diagnosis not present

## 2019-11-30 ENCOUNTER — Encounter: Payer: Self-pay | Admitting: Dermatology

## 2019-11-30 ENCOUNTER — Other Ambulatory Visit: Payer: Self-pay

## 2019-11-30 ENCOUNTER — Ambulatory Visit (INDEPENDENT_AMBULATORY_CARE_PROVIDER_SITE_OTHER): Payer: Medicare Other | Admitting: Dermatology

## 2019-11-30 DIAGNOSIS — D485 Neoplasm of uncertain behavior of skin: Secondary | ICD-10-CM | POA: Diagnosis not present

## 2019-11-30 NOTE — Patient Instructions (Signed)

## 2019-12-05 ENCOUNTER — Encounter: Payer: Self-pay | Admitting: Dermatology

## 2019-12-05 NOTE — Progress Notes (Signed)
   Follow-Up Visit   Subjective  Robert Simon. is a 64 y.o. male who presents for the following: Skin Problem (here for have spots removed off of eye).  Growths Location: Chest and left upper lower eyelid Duration: Chest just 1 to 2 months Quality: All growing Associated Signs/Symptoms: Modifying Factors:  Severity:  Timing: Context:   The following portions of the chart were reviewed this encounter and updated as appropriate:     Objective  Well appearing patient in no apparent distress; mood and affect are within normal limits.  All skin waist up examined.   Assessment & Plan  Neoplasm of uncertain behavior of skin (3) Left Breast  Skin / nail biopsy Type of biopsy: tangential   Informed consent: discussed and consent obtained   Timeout: patient name, date of birth, surgical site, and procedure verified   Procedure prep:  Patient was prepped and draped in usual sterile fashion Prep type:  Chlorhexidine Anesthesia: the lesion was anesthetized in a standard fashion   Anesthetic:  1% lidocaine w/ epinephrine 1-100,000 local infiltration Instrument used: flexible razor blade   Hemostasis achieved with: ferric subsulfate   Outcome: patient tolerated procedure well   Post-procedure details: wound care instructions given    Specimen 1 - Surgical pathology Differential Diagnosis: scc vs bcc Check Margins: No  Left Upper Eyelid  Skin / nail biopsy Type of biopsy: tangential   Informed consent: discussed and consent obtained   Timeout: patient name, date of birth, surgical site, and procedure verified   Procedure prep:  Patient was prepped and draped in usual sterile fashion Prep type:  Chlorhexidine Anesthesia: the lesion was anesthetized in a standard fashion   Anesthetic:  1% lidocaine w/ epinephrine 1-100,000 local infiltration Instrument used: flexible razor blade   Hemostasis achieved with: ferric subsulfate   Outcome: patient tolerated procedure well     Post-procedure details: wound care instructions given    Specimen 2 - Surgical pathology Differential Diagnosis: scc vs bcc Check Margins: No  Left Lower Eyelid   Skin / nail biopsy Type of biopsy: tangential   Informed consent: discussed and consent obtained   Timeout: patient name, date of birth, surgical site, and procedure verified   Procedure prep:  Patient was prepped and draped in usual sterile fashion Prep type:  Chlorhexidine Anesthesia: the lesion was anesthetized in a standard fashion   Anesthetic:  1% lidocaine w/ epinephrine 1-100,000 local infiltration Instrument used: flexible razor blade   Hemostasis achieved with: ferric subsulfate   Outcome: patient tolerated procedure well   Post-procedure details: wound care instructions given    Specimen 3 - Surgical pathology Differential Diagnosis:  Check Margins: No

## 2020-01-31 ENCOUNTER — Ambulatory Visit (INDEPENDENT_AMBULATORY_CARE_PROVIDER_SITE_OTHER): Payer: Medicare Other | Admitting: Dermatology

## 2020-01-31 ENCOUNTER — Encounter: Payer: Self-pay | Admitting: Dermatology

## 2020-01-31 ENCOUNTER — Other Ambulatory Visit: Payer: Self-pay

## 2020-01-31 DIAGNOSIS — D1800 Hemangioma unspecified site: Secondary | ICD-10-CM

## 2020-01-31 DIAGNOSIS — D225 Melanocytic nevi of trunk: Secondary | ICD-10-CM

## 2020-01-31 DIAGNOSIS — L570 Actinic keratosis: Secondary | ICD-10-CM | POA: Diagnosis not present

## 2020-01-31 DIAGNOSIS — L821 Other seborrheic keratosis: Secondary | ICD-10-CM

## 2020-01-31 DIAGNOSIS — D229 Melanocytic nevi, unspecified: Secondary | ICD-10-CM

## 2020-01-31 NOTE — Patient Instructions (Addendum)
First follow-up in several years for Robert Simon date of birth 1956/04/27.  Of most concern are 2 new growths in the left front scalp and left sideburn.  Examination showed 1 cm keratotic tan papules that represent clinically benign keratoses.  The one on the sideburn is subject to recurrent irritation from shaving and he may return in the fall for simple removal.   On each ear (1 left, 3 right) are hornlike crusts with a larger spot on the right forearm.  These are precancerous solar keratoses and were treated with 5-second liquid nitrogen freeze.  These may swell and peel in the next 2 weeks.  There is no restriction or special care.  General skin examination from the waist up showed no atypical mole, melanoma, or nonmobile skin cancer.  He is prone to get scattered red angiomas on the torso and skin tags on rub areas.  These require no intervention.  Routine follow-up in the fall.

## 2020-02-23 ENCOUNTER — Ambulatory Visit: Payer: Self-pay | Attending: Internal Medicine

## 2020-02-23 DIAGNOSIS — Z23 Encounter for immunization: Secondary | ICD-10-CM

## 2020-02-23 NOTE — Progress Notes (Signed)
   Covid-19 Vaccination Clinic  Name:  Robert Simon    MRN: [id number] DOB: February 12, 1956  02/23/2020  Mr. Robert Simon was observed post Covid-19 immunization for 30 minutes based on pre-vaccination screening without incident. He was provided with Vaccine Information Sheet and instruction to access the V-Safe system.   Mr. Robert Simon was instructed to call 911 with any severe reactions post vaccine: Marland Kitchen Difficulty breathing  . Swelling of face and throat  . A fast heartbeat  . A bad rash all over body  . Dizziness and weakness

## 2020-03-11 ENCOUNTER — Encounter: Payer: Self-pay | Admitting: Dermatology

## 2020-03-11 NOTE — Progress Notes (Signed)
   Follow-Up Visit   Subjective  Robert Escandon. is a 64 y.o. male who presents for the following: Follow-up (pt stated--scalp/left side of the face--dark spot and getting larger.).  Growths Location: Scalp and face Duration:  Quality:  Associated Signs/Symptoms: Modifying Factors:  Severity:  Timing: Context:   Objective  Well appearing patient in no apparent distress; mood and affect are within normal limits.  All skin waist up examined.   Assessment & Plan    AK (actinic keratosis) (5) Left Ear; Right Ear (3); Right Forearm - Posterior  Destruction of lesion - Left Ear, Right Ear, Right Forearm - Posterior Complexity: simple   Destruction method: cryotherapy   Informed consent: discussed and consent obtained   Timeout:  patient name, date of birth, surgical site, and procedure verified Lesion destroyed using liquid nitrogen: Yes   Region frozen until ice ball extended beyond lesion: Yes   Outcome: patient tolerated procedure well with no complications   Post-procedure details: wound care instructions given    Hemangioma, unspecified site back, chest, face, neck  Leave if stable  Seborrheic keratosis Left sideburn  May choose to remove this in the late fall or winter because of recurrent irritation from shaving.  Nevus Mid Back  Annual skin examination   First follow-up in several years for Robert Simon Junior date of birth 1955/12/14.  Of most concern are 2 new growths in the left front scalp and left sideburn.  Examination showed 1 cm keratotic tan papules that represent clinically benign keratoses.  The one on the sideburn is subject to recurrent irritation from shaving and he may return in the fall for simple removal.   On each ear (1 left, 3 right) are hornlike crusts with a larger spot on the right forearm.  These are precancerous solar keratoses and were treated with 5-second liquid nitrogen freeze.  These may swell and peel in the next 2 weeks.   There is no restriction or special care.  General skin examination from the waist up showed no atypical mole, melanoma, or nonmobile skin cancer.  He is prone to get scattered red angiomas on the torso and skin tags on rub areas.  These require no intervention.  Routine follow-up in the fall.  I, Lavonna Monarch, MD, have reviewed all documentation for this visit.  The documentation on 03/11/20 for the exam, diagnosis, procedures, and orders are all accurate and complete.

## 2020-03-14 ENCOUNTER — Encounter (INDEPENDENT_AMBULATORY_CARE_PROVIDER_SITE_OTHER): Payer: Medicare Other | Admitting: Ophthalmology

## 2020-05-22 ENCOUNTER — Other Ambulatory Visit: Payer: Self-pay

## 2020-05-22 ENCOUNTER — Ambulatory Visit: Payer: Medicare Other | Admitting: Dermatology

## 2020-05-22 ENCOUNTER — Other Ambulatory Visit: Payer: Self-pay | Admitting: *Deleted

## 2020-05-22 DIAGNOSIS — N429 Disorder of prostate, unspecified: Secondary | ICD-10-CM

## 2020-05-22 DIAGNOSIS — Z125 Encounter for screening for malignant neoplasm of prostate: Secondary | ICD-10-CM

## 2020-05-22 NOTE — Progress Notes (Signed)
Patient: Robert Simon.           Date of Birth: 1955/12/05           MRN: [id number] Visit Date: 05/22/2020 PCP: Tempie Hoist, MD  Prostate Cancer Screening Date of last physical exam:  (doesn't know) Date of last rectal exam:  (doesn't know) Have you ever had any of the following?: Family member with prostate cancer Have you ever had or been told you have an allergy to latex products?: No Are you currently taking any natural prostate preparations?: No Are you currently experiencing any urinary symptoms?: No  Prostate Exam Exam not completed.   PSA Only.  Patient's History There are no problems to display for this patient.  Past Medical History:  Diagnosis Date  . Complication of anesthesia    PT STATES ANESTHESIA AS A CHILD MADE HIM VERY HYPER-PT VERY CONCERNED HE WILL GET TOO MUCH ANESTHESIA-PT STATES HE ONLY NEEDS HALF OF WHAT A NORMAL PERSON WOULD GET-BE VERY CAUTIOUS WITH HIS SHOULDERS-PT CONCERNED OF PLACEMENT ON OR TABLE DUE TO HIS BAD SHOULDERS (RIGHT WORSE THAN THE LEFT)  . Shoulder pain, bilateral     No family history on file.  Social History   Occupational History  . Not on file  Tobacco Use  . Smoking status: Never Smoker  . Smokeless tobacco: Never Used  Substance and Sexual Activity  . Alcohol use: Yes    Comment: RARE  . Drug use: No  . Sexual activity: Yes

## 2020-05-22 NOTE — Addendum Note (Signed)
Addended by: Demetrius Revel on: 05/22/2020 04:35 PM   Modules accepted: Orders

## 2020-05-24 LAB — PROSTATE-SPECIFIC AG, SERUM (LABCORP): Prostate Specific Ag, Serum: 0.7 ng/mL (ref 0.0–4.0)

## 2020-05-24 NOTE — Progress Notes (Signed)
PSA normal

## 2020-05-27 ENCOUNTER — Telehealth: Payer: Self-pay

## 2020-05-27 NOTE — Telephone Encounter (Addendum)
Patient returned call and was informed normal PSA results. Patient verbalized understanding.  Attempted to contact patient regarding lab results(PSA). Left message on voicemail requesting return call.

## 2020-07-23 ENCOUNTER — Other Ambulatory Visit: Payer: Self-pay

## 2020-07-23 ENCOUNTER — Encounter (INDEPENDENT_AMBULATORY_CARE_PROVIDER_SITE_OTHER): Payer: Medicare Other | Admitting: Ophthalmology

## 2020-07-23 DIAGNOSIS — H2513 Age-related nuclear cataract, bilateral: Secondary | ICD-10-CM | POA: Diagnosis not present

## 2020-07-23 DIAGNOSIS — H43813 Vitreous degeneration, bilateral: Secondary | ICD-10-CM | POA: Diagnosis not present

## 2020-07-23 DIAGNOSIS — H33303 Unspecified retinal break, bilateral: Secondary | ICD-10-CM | POA: Diagnosis not present

## 2020-07-24 ENCOUNTER — Encounter (INDEPENDENT_AMBULATORY_CARE_PROVIDER_SITE_OTHER): Payer: Medicare Other | Admitting: Ophthalmology

## 2020-08-12 ENCOUNTER — Ambulatory Visit: Payer: Medicare Other | Admitting: Dermatology

## 2021-03-08 ENCOUNTER — Emergency Department (HOSPITAL_COMMUNITY): Payer: Medicare Other | Admitting: Anesthesiology

## 2021-03-08 ENCOUNTER — Observation Stay (HOSPITAL_COMMUNITY)
Admission: EM | Admit: 2021-03-08 | Discharge: 2021-03-09 | Disposition: A | Discharge status: Home / Self Care | Payer: Medicare Other | Attending: Surgery | Admitting: Surgery

## 2021-03-08 ENCOUNTER — Emergency Department (HOSPITAL_COMMUNITY): Payer: Medicare Other

## 2021-03-08 ENCOUNTER — Other Ambulatory Visit: Payer: Self-pay

## 2021-03-08 ENCOUNTER — Encounter (HOSPITAL_COMMUNITY)
Admission: EM | Disposition: A | Discharge status: Home / Self Care | Payer: Self-pay | Source: Home / Self Care | Attending: Emergency Medicine

## 2021-03-08 ENCOUNTER — Encounter (HOSPITAL_COMMUNITY): Payer: Self-pay

## 2021-03-08 DIAGNOSIS — K3532 Acute appendicitis with perforation and localized peritonitis, without abscess: Principal | ICD-10-CM | POA: Insufficient documentation

## 2021-03-08 DIAGNOSIS — Z20822 Contact with and (suspected) exposure to covid-19: Secondary | ICD-10-CM | POA: Insufficient documentation

## 2021-03-08 DIAGNOSIS — K3580 Unspecified acute appendicitis: Secondary | ICD-10-CM | POA: Diagnosis present

## 2021-03-08 DIAGNOSIS — R1031 Right lower quadrant pain: Secondary | ICD-10-CM | POA: Diagnosis present

## 2021-03-08 HISTORY — PX: LAPAROSCOPIC APPENDECTOMY: SHX408

## 2021-03-08 LAB — URINALYSIS, ROUTINE W REFLEX MICROSCOPIC
Bilirubin Urine: NEGATIVE
Glucose, UA: NEGATIVE mg/dL
Hgb urine dipstick: NEGATIVE
Ketones, ur: NEGATIVE mg/dL
Leukocytes,Ua: NEGATIVE
Nitrite: NEGATIVE
Protein, ur: NEGATIVE mg/dL
Specific Gravity, Urine: 1.025 (ref 1.005–1.030)
pH: 6 (ref 5.0–8.0)

## 2021-03-08 LAB — CBC
HCT: 46.7 % (ref 39.0–52.0)
Hemoglobin: 15.6 g/dL (ref 13.0–17.0)
MCH: 29.4 pg (ref 26.0–34.0)
MCHC: 33.4 g/dL (ref 30.0–36.0)
MCV: 88.1 fL (ref 80.0–100.0)
Platelets: 228 10*3/uL (ref 150–400)
RBC: 5.3 MIL/uL (ref 4.22–5.81)
RDW: 12.1 % (ref 11.5–15.5)
WBC: 15.2 10*3/uL — ABNORMAL HIGH (ref 4.0–10.5)
nRBC: 0 % (ref 0.0–0.2)

## 2021-03-08 LAB — COMPREHENSIVE METABOLIC PANEL
ALT: 21 U/L (ref 0–44)
AST: 19 U/L (ref 15–41)
Albumin: 4.1 g/dL (ref 3.5–5.0)
Alkaline Phosphatase: 42 U/L (ref 38–126)
Anion gap: 7 (ref 5–15)
BUN: 17 mg/dL (ref 8–23)
CO2: 23 mmol/L (ref 22–32)
Calcium: 9.1 mg/dL (ref 8.9–10.3)
Chloride: 103 mmol/L (ref 98–111)
Creatinine, Ser: 0.78 mg/dL (ref 0.61–1.24)
GFR, Estimated: >60 mL/min (ref >60)
Glucose, Bld: 142 mg/dL — ABNORMAL HIGH (ref 70–99)
Potassium: 3.6 mmol/L (ref 3.5–5.1)
Sodium: 133 mmol/L — ABNORMAL LOW (ref 135–145)
Total Bilirubin: 1 mg/dL (ref 0.3–1.2)
Total Protein: 7.1 g/dL (ref 6.5–8.1)

## 2021-03-08 LAB — RESP PANEL BY RT-PCR (FLU A&B, COVID) ARPGX2
Influenza A by PCR: NEGATIVE
Influenza B by PCR: NEGATIVE
SARS Coronavirus 2 by RT PCR: NEGATIVE

## 2021-03-08 LAB — LIPASE, BLOOD: Lipase: 28 U/L (ref 11–51)

## 2021-03-08 SURGERY — APPENDECTOMY, LAPAROSCOPIC
Anesthesia: General | Site: Abdomen

## 2021-03-08 MED ORDER — ONDANSETRON HCL 4 MG/2ML IJ SOLN: 4.0000 mg | Freq: Four times a day (QID) | INTRAMUSCULAR | Status: DC | PRN

## 2021-03-08 MED ORDER — LIDOCAINE 2% (20 MG/ML) 5 ML SYRINGE
INTRAMUSCULAR | Status: AC
  Filled 2021-03-08: qty 5

## 2021-03-08 MED ORDER — ACETAMINOPHEN 325 MG PO TABS: 650.0000 mg | ORAL_TABLET | Freq: Four times a day (QID) | ORAL | Status: DC | PRN

## 2021-03-08 MED ORDER — LACTATED RINGERS IV SOLN: INTRAVENOUS | Status: DC

## 2021-03-08 MED ORDER — ONDANSETRON HCL 4 MG/2ML IJ SOLN
4.0000 mg | Freq: Once | INTRAMUSCULAR | Status: AC
  Administered 2021-03-08: 4 mg via INTRAVENOUS
  Filled 2021-03-08: qty 2

## 2021-03-08 MED ORDER — METRONIDAZOLE 500 MG/100ML IV SOLN
500.0000 mg | Freq: Two times a day (BID) | INTRAVENOUS | Status: DC
Start: 2021-03-08 — End: 2021-03-09
  Administered 2021-03-08: 500 mg via INTRAVENOUS
  Filled 2021-03-08: qty 100

## 2021-03-08 MED ORDER — SODIUM CHLORIDE 0.9 % IV SOLN
2.0000 g | INTRAVENOUS | Status: DC
  Filled 2021-03-08: qty 20

## 2021-03-08 MED ORDER — MORPHINE SULFATE (PF) 4 MG/ML IV SOLN
4.0000 mg | Freq: Once | INTRAVENOUS | Status: AC
  Administered 2021-03-08: 4 mg via INTRAVENOUS
  Filled 2021-03-08: qty 1

## 2021-03-08 MED ORDER — DEXAMETHASONE SODIUM PHOSPHATE 10 MG/ML IJ SOLN
INTRAMUSCULAR | Status: DC | PRN
  Administered 2021-03-08: 10 mg via INTRAVENOUS

## 2021-03-08 MED ORDER — ONDANSETRON HCL 4 MG/2ML IJ SOLN
INTRAMUSCULAR | Status: DC | PRN
  Administered 2021-03-08: 4 mg via INTRAVENOUS

## 2021-03-08 MED ORDER — OXYCODONE HCL 5 MG PO TABS
5.0000 mg | ORAL_TABLET | Freq: Once | ORAL | Status: DC | PRN
Start: 2021-03-08 — End: 2021-03-08

## 2021-03-08 MED ORDER — SODIUM CHLORIDE 0.9 % IV BOLUS
500.0000 mL | Freq: Once | INTRAVENOUS | Status: AC
  Administered 2021-03-08: 500 mL via INTRAVENOUS

## 2021-03-08 MED ORDER — DEXAMETHASONE SODIUM PHOSPHATE 10 MG/ML IJ SOLN
INTRAMUSCULAR | Status: AC
  Filled 2021-03-08: qty 1

## 2021-03-08 MED ORDER — TRAMADOL HCL 50 MG PO TABS: 50.0000 mg | ORAL_TABLET | Freq: Four times a day (QID) | ORAL | Status: DC | PRN

## 2021-03-08 MED ORDER — FENTANYL CITRATE (PF) 100 MCG/2ML IJ SOLN
INTRAMUSCULAR | Status: DC | PRN
  Administered 2021-03-08 (×2): 50 ug via INTRAVENOUS

## 2021-03-08 MED ORDER — SUCCINYLCHOLINE CHLORIDE 200 MG/10ML IV SOSY
PREFILLED_SYRINGE | INTRAVENOUS | Status: AC
  Filled 2021-03-08: qty 10

## 2021-03-08 MED ORDER — SODIUM CHLORIDE 0.9 % IV SOLN
1.0000 g | Freq: Once | INTRAVENOUS | Status: AC
  Administered 2021-03-08: 1 g via INTRAVENOUS
  Filled 2021-03-08: qty 1

## 2021-03-08 MED ORDER — PROPOFOL 10 MG/ML IV BOLUS
INTRAVENOUS | Status: AC
  Filled 2021-03-08: qty 20

## 2021-03-08 MED ORDER — BUPIVACAINE-EPINEPHRINE 0.25% -1:200000 IJ SOLN
INTRAMUSCULAR | Status: DC | PRN
  Administered 2021-03-08: 20 mL

## 2021-03-08 MED ORDER — KETAMINE HCL 10 MG/ML IJ SOLN
INTRAMUSCULAR | Status: AC
  Filled 2021-03-08: qty 1

## 2021-03-08 MED ORDER — FENTANYL CITRATE (PF) 100 MCG/2ML IJ SOLN
INTRAMUSCULAR | Status: AC
  Filled 2021-03-08: qty 2

## 2021-03-08 MED ORDER — BUPIVACAINE-EPINEPHRINE (PF) 0.25% -1:200000 IJ SOLN
INTRAMUSCULAR | Status: AC
  Filled 2021-03-08: qty 30

## 2021-03-08 MED ORDER — OXYCODONE HCL 5 MG PO TABS: 5.0000 mg | ORAL_TABLET | ORAL | Status: DC | PRN

## 2021-03-08 MED ORDER — PHENYLEPHRINE 40 MCG/ML (10ML) SYRINGE FOR IV PUSH (FOR BLOOD PRESSURE SUPPORT)
PREFILLED_SYRINGE | INTRAVENOUS | Status: DC | PRN
  Administered 2021-03-08 (×5): 200 ug via INTRAVENOUS

## 2021-03-08 MED ORDER — PROPOFOL 10 MG/ML IV BOLUS
INTRAVENOUS | Status: DC | PRN
  Administered 2021-03-08: 160 mg via INTRAVENOUS

## 2021-03-08 MED ORDER — IOHEXOL 350 MG/ML SOLN
80.0000 mL | Freq: Once | INTRAVENOUS | Status: AC | PRN
  Administered 2021-03-08: 80 mL via INTRAVENOUS

## 2021-03-08 MED ORDER — ROCURONIUM BROMIDE 10 MG/ML (PF) SYRINGE
PREFILLED_SYRINGE | INTRAVENOUS | Status: DC | PRN
  Administered 2021-03-08: 80 mg via INTRAVENOUS

## 2021-03-08 MED ORDER — KETOROLAC TROMETHAMINE 30 MG/ML IJ SOLN
INTRAMUSCULAR | Status: AC
  Administered 2021-03-08: 30 mg
  Filled 2021-03-08: qty 1

## 2021-03-08 MED ORDER — PROMETHAZINE HCL 25 MG/ML IJ SOLN: 6.2500 mg | INTRAMUSCULAR | Status: DC | PRN

## 2021-03-08 MED ORDER — METRONIDAZOLE 500 MG/100ML IV SOLN
500.0000 mg | Freq: Once | INTRAVENOUS | Status: AC
  Administered 2021-03-08: 500 mg via INTRAVENOUS
  Filled 2021-03-08: qty 100

## 2021-03-08 MED ORDER — FENTANYL CITRATE PF 50 MCG/ML IJ SOSY: 25.0000 ug | PREFILLED_SYRINGE | INTRAMUSCULAR | Status: DC | PRN

## 2021-03-08 MED ORDER — LIDOCAINE 2% (20 MG/ML) 5 ML SYRINGE
INTRAMUSCULAR | Status: DC | PRN
  Administered 2021-03-08: 100 mg via INTRAVENOUS

## 2021-03-08 MED ORDER — SODIUM CHLORIDE 0.9 % IV SOLN
1.0000 g | Freq: Once | INTRAVENOUS | Status: AC
  Administered 2021-03-08: 1 g via INTRAVENOUS
  Filled 2021-03-08: qty 10

## 2021-03-08 MED ORDER — ACETAMINOPHEN 650 MG RE SUPP: 650.0000 mg | Freq: Four times a day (QID) | RECTAL | Status: DC | PRN

## 2021-03-08 MED ORDER — KETOROLAC TROMETHAMINE 30 MG/ML IJ SOLN
15.0000 mg | Freq: Once | INTRAMUSCULAR | Status: AC | PRN
Start: 2021-03-08 — End: 2021-03-08
  Administered 2021-03-08: 15 mg via INTRAVENOUS

## 2021-03-08 MED ORDER — MIDAZOLAM HCL 5 MG/5ML IJ SOLN
INTRAMUSCULAR | Status: DC | PRN
  Administered 2021-03-08: 2 mg via INTRAVENOUS

## 2021-03-08 MED ORDER — ROCURONIUM BROMIDE 10 MG/ML (PF) SYRINGE
PREFILLED_SYRINGE | INTRAVENOUS | Status: AC
  Filled 2021-03-08: qty 10

## 2021-03-08 MED ORDER — ONDANSETRON 4 MG PO TBDP: 4.0000 mg | ORAL_TABLET | Freq: Four times a day (QID) | ORAL | Status: DC | PRN

## 2021-03-08 MED ORDER — SUCCINYLCHOLINE CHLORIDE 200 MG/10ML IV SOSY
PREFILLED_SYRINGE | INTRAVENOUS | Status: DC | PRN
  Administered 2021-03-08: 160 mg via INTRAVENOUS

## 2021-03-08 MED ORDER — OXYCODONE HCL 5 MG/5ML PO SOLN
5.0000 mg | Freq: Once | ORAL | Status: DC | PRN
Start: 2021-03-08 — End: 2021-03-08

## 2021-03-08 MED ORDER — ONDANSETRON HCL 4 MG/2ML IJ SOLN
INTRAMUSCULAR | Status: AC
  Filled 2021-03-08: qty 2

## 2021-03-08 MED ORDER — MIDAZOLAM HCL 2 MG/2ML IJ SOLN
INTRAMUSCULAR | Status: AC
  Filled 2021-03-08: qty 2

## 2021-03-08 MED ORDER — SUGAMMADEX SODIUM 200 MG/2ML IV SOLN
INTRAVENOUS | Status: DC | PRN
  Administered 2021-03-08: 200 mg via INTRAVENOUS

## 2021-03-08 MED ORDER — ALBUMIN HUMAN 5 % IV SOLN: INTRAVENOUS | Status: DC | PRN

## 2021-03-08 MED ORDER — FENTANYL CITRATE PF 50 MCG/ML IJ SOSY
PREFILLED_SYRINGE | INTRAMUSCULAR | Status: AC
  Filled 2021-03-08: qty 2

## 2021-03-08 MED ORDER — HYDROMORPHONE HCL 1 MG/ML IJ SOLN: 1.0000 mg | INTRAMUSCULAR | Status: DC | PRN

## 2021-03-08 SURGICAL SUPPLY — 41 items
ADH SKN CLS APL DERMABOND .7 (GAUZE/BANDAGES/DRESSINGS) ×1
APL PRP STRL LF DISP 70% ISPRP (MISCELLANEOUS) ×1
APPLIER CLIP ROT 10 11.4 M/L (STAPLE)
APR CLP MED LRG 11.4X10 (STAPLE)
BAG COUNTER SPONGE SURGICOUNT (BAG) IMPLANT
BAG SPEC RTRVL LRG 6X4 10 (ENDOMECHANICALS) ×1
BAG SPNG CNTER NS LX DISP (BAG)
CHLORAPREP W/TINT 26 (MISCELLANEOUS) ×2 IMPLANT
CLIP APPLIE ROT 10 11.4 M/L (STAPLE) IMPLANT
COVER SURGICAL LIGHT HANDLE (MISCELLANEOUS) ×2 IMPLANT
CUTTER FLEX LINEAR 45M (STAPLE) IMPLANT
DECANTER SPIKE VIAL GLASS SM (MISCELLANEOUS) ×2 IMPLANT
DERMABOND ADVANCED (GAUZE/BANDAGES/DRESSINGS) ×1
DERMABOND ADVANCED .7 DNX12 (GAUZE/BANDAGES/DRESSINGS) ×1 IMPLANT
DRAPE LAPAROSCOPIC ABDOMINAL (DRAPES) ×2 IMPLANT
ELECT REM PT RETURN 15FT ADLT (MISCELLANEOUS) ×2 IMPLANT
ENDOLOOP SUT PDS II  0 18 (SUTURE)
ENDOLOOP SUT PDS II 0 18 (SUTURE) IMPLANT
GLOVE SURG ORTHO LTX SZ8 (GLOVE) ×2 IMPLANT
GLOVE SURG SYN 7.5  E (GLOVE) ×2
GLOVE SURG SYN 7.5 E (GLOVE) ×1 IMPLANT
GOWN STRL REUS W/TWL XL LVL3 (GOWN DISPOSABLE) ×4 IMPLANT
KIT BASIN OR (CUSTOM PROCEDURE TRAY) ×2 IMPLANT
KIT TURNOVER KIT A (KITS) ×2 IMPLANT
PENCIL SMOKE EVACUATOR (MISCELLANEOUS) IMPLANT
POUCH SPECIMEN RETRIEVAL 10MM (ENDOMECHANICALS) ×2 IMPLANT
RELOAD 45 VASCULAR/THIN (ENDOMECHANICALS) IMPLANT
RELOAD STAPLE TA45 3.5 REG BLU (ENDOMECHANICALS) ×2 IMPLANT
SET IRRIG TUBING LAPAROSCOPIC (IRRIGATION / IRRIGATOR) ×2 IMPLANT
SET TUBE SMOKE EVAC HIGH FLOW (TUBING) ×2 IMPLANT
SHEARS HARMONIC ACE PLUS 36CM (ENDOMECHANICALS) ×2 IMPLANT
STRIP CLOSURE SKIN 1/2X4 (GAUZE/BANDAGES/DRESSINGS) ×2 IMPLANT
SUT MNCRL AB 4-0 PS2 18 (SUTURE) ×2 IMPLANT
TOWEL OR 17X26 10 PK STRL BLUE (TOWEL DISPOSABLE) ×2 IMPLANT
TOWEL OR NON WOVEN STRL DISP B (DISPOSABLE) ×2 IMPLANT
TRAY FOLEY MTR SLVR 14FR STAT (SET/KITS/TRAYS/PACK) IMPLANT
TRAY FOLEY MTR SLVR 16FR STAT (SET/KITS/TRAYS/PACK) IMPLANT
TRAY LAPAROSCOPIC (CUSTOM PROCEDURE TRAY) ×2 IMPLANT
TROCAR BLADELESS OPT 5 100 (ENDOMECHANICALS) ×2 IMPLANT
TROCAR XCEL BLUNT TIP 100MML (ENDOMECHANICALS) ×2 IMPLANT
TROCAR XCEL NON-BLD 11X100MML (ENDOMECHANICALS) ×2 IMPLANT

## 2021-03-08 NOTE — Anesthesia Postprocedure Evaluation (Signed)
Anesthesia Post Note  Patient: Robert Simon.  Procedure(s) Performed: APPENDECTOMY LAPAROSCOPIC (Abdomen)     Patient location during evaluation: PACU Anesthesia Type: General Level of consciousness: awake and alert Pain management: pain level controlled Vital Signs Assessment: post-procedure vital signs reviewed and stable Respiratory status: spontaneous breathing, nonlabored ventilation, respiratory function stable and patient connected to nasal cannula oxygen Cardiovascular status: blood pressure returned to baseline and stable Postop Assessment: no apparent nausea or vomiting Anesthetic complications: no   No notable events documented.  Last Vitals:  Vitals:   03/08/21 1710 03/08/21 1712  BP:    Pulse: 100 97  Resp: 16 17  Temp: 36.6 C   SpO2: 94% 94%    Last Pain:  Vitals:   03/08/21 1710  TempSrc:   PainSc: 0-No pain    LLE Motor Response: Responds to commands;Purposeful movement (03/08/21 1710)   RLE Motor Response: Purposeful movement;Responds to commands (03/08/21 1710)        Arrington Yohe S

## 2021-03-08 NOTE — Transfer of Care (Signed)
Immediate Anesthesia Transfer of Care Note  Patient: Robert Simon.  Procedure(s) Performed: APPENDECTOMY LAPAROSCOPIC (Abdomen)  Patient Location: PACU  Anesthesia Type:General  Level of Consciousness: awake, alert  and oriented  Airway & Oxygen Therapy: Patient Spontanous Breathing and Patient connected to face mask  Post-op Assessment: Report given to RN and Post -op Vital signs reviewed and stable  Post vital signs: ReviewedStable  Last Vitals:  Vitals Value Taken Time  BP 114/80 03/08/21 1708  Temp    Pulse 95 03/08/21 1714  Resp 18 03/08/21 1714  SpO2 97 % 03/08/21 1714  Vitals shown include unvalidated device data.  Last Pain:  Vitals:   03/08/21 1500  TempSrc: Oral  PainSc:       Patients Stated Pain Goal: 3 ([id number])  Complications: No notable events documented.

## 2021-03-08 NOTE — ED Triage Notes (Signed)
Pt c/o RLQ abdominal pain and tenderness with nausea since 6pm yesterday. Pt endorses nausea, but denies v/d.

## 2021-03-08 NOTE — H&P (Signed)
Robert Simon. is an 65 y.o. male.        Chief Complaint: abdominal pain, acute appendicitis  HPI: Patient is a 65 year old male who presents to the emergency department with 24-hour history of generalized abdominal pain localizing to the right lower quadrant.  He has had some chills and sweats.  He has had nausea but no emesis.  Patient was evaluated in the emergency department and was noted to have an elevated white blood cell count of 15,000.  CT scan of the abdomen and pelvis was obtained and showed findings consistent with acute appendicitis without complication.  General surgery was called for evaluation and management.  Past medical history notable for umbilical hernia repair with mesh in [place], [place].  Patient also underwent repair of a right inguinal hernia.  Patient is a Manufacturing engineer.  Past Medical History:  Diagnosis Date   Complication of anesthesia    PT STATES ANESTHESIA AS A CHILD MADE HIM VERY HYPER-PT VERY CONCERNED HE WILL GET TOO MUCH ANESTHESIA-PT STATES HE ONLY NEEDS HALF OF WHAT A NORMAL PERSON WOULD GET-BE VERY CAUTIOUS WITH HIS SHOULDERS-PT CONCERNED OF PLACEMENT ON OR TABLE DUE TO HIS BAD SHOULDERS (RIGHT WORSE THAN THE LEFT)   Shoulder pain, bilateral     Past Surgical History:  Procedure Laterality Date   COLONOSCOPY     HERNIA REPAIR     AGE 35   INGUINAL HERNIA REPAIR Right 04/17/2016   Procedure: HERNIA REPAIR INGUINAL ADULT;  Surgeon: Leonie Green, MD;  Location: [facility] ORS;  Service: General;  Laterality: Right;   UMBILICAL HERNIA REPAIR N/A 04/17/2016   Procedure: HERNIA REPAIR UMBILICAL ADULT;  Surgeon: Leonie Green, MD;  Location: [facility] ORS;  Service: General;  Laterality: N/A;   WISDOM TOOTH EXTRACTION      History reviewed. No pertinent family history. Social History:  reports that he has never smoked. He has never used smokeless tobacco. He reports current alcohol use. He reports that he does not use  drugs.  Allergies: No Known Allergies  (Not in a hospital admission)   Results for orders placed or performed during the hospital encounter of 03/08/21 (from the past 48 hour(s))  Lipase, blood     Status: None   Collection Time: 03/08/21  8:49 AM  Result Value Ref Range   Lipase 28 11 - 51 U/L    Comment: Performed at [facility], [place] [address] [place], [place] [address]  Comprehensive metabolic panel     Status: Abnormal   Collection Time: 03/08/21  8:49 AM  Result Value Ref Range   Sodium 133 (L) 135 - 145 mmol/L   Potassium 3.6 3.5 - 5.1 mmol/L   Chloride 103 98 - 111 mmol/L   CO2 23 22 - 32 mmol/L   Glucose, Bld 142 (H) 70 - 99 mg/dL    Comment: Glucose reference range applies only to samples taken after fasting for at least 8 hours.   BUN 17 8 - 23 mg/dL   Creatinine, Ser 0.78 0.61 - 1.24 mg/dL   Calcium 9.1 8.9 - 10.3 mg/dL   Total Protein 7.1 6.5 - 8.1 g/dL   Albumin 4.1 3.5 - 5.0 g/dL   AST 19 15 - 41 U/L   ALT 21 0 - 44 U/L   Alkaline Phosphatase 42 38 - 126 U/L   Total Bilirubin 1.0 0.3 - 1.2 mg/dL   GFR, Estimated >60 >60 mL/min    Comment: (NOTE) Calculated using the CKD-EPI  Creatinine Equation (2021)    Anion gap 7 5 - 15    Comment: Performed at [facility], [place] [address], [place], [place] [address]  CBC     Status: Abnormal   Collection Time: 03/08/21  8:49 AM  Result Value Ref Range   WBC 15.2 (H) 4.0 - 10.5 K/uL   RBC 5.30 4.22 - 5.81 MIL/uL   Hemoglobin 15.6 13.0 - 17.0 g/dL   HCT 46.7 39.0 - 52.0 %   MCV 88.1 80.0 - 100.0 fL   MCH 29.4 26.0 - 34.0 pg   MCHC 33.4 30.0 - 36.0 g/dL   RDW 12.1 11.5 - 15.5 %   Platelets 228 150 - 400 K/uL   nRBC 0.0 0.0 - 0.2 %    Comment: Performed at [facility], [place] [address], [place], [place] [address]  Urinalysis, Routine w reflex microscopic Urine, Clean Catch     Status: Abnormal   Collection Time: 03/08/21 11:06 AM  Result Value Ref Range    Color, Urine YELLOW (A) YELLOW   APPearance CLEAR (A) CLEAR   Specific Gravity, Urine 1.025 1.005 - 1.030   pH 6.0 5.0 - 8.0   Glucose, UA NEGATIVE NEGATIVE mg/dL   Hgb urine dipstick NEGATIVE NEGATIVE   Bilirubin Urine NEGATIVE NEGATIVE   Ketones, ur NEGATIVE NEGATIVE mg/dL   Protein, ur NEGATIVE NEGATIVE mg/dL   Nitrite NEGATIVE NEGATIVE   Leukocytes,Ua NEGATIVE NEGATIVE   RBC / HPF 0-5 0 - 5 RBC/hpf   WBC, UA 0-5 0 - 5 WBC/hpf   Bacteria, UA RARE (A) NONE SEEN   Mucus PRESENT    Sperm, UA PRESENT     Comment: Performed at [facility], [place] [address] [place], [place] [address]  Resp Panel by RT-PCR (Flu A&B, Covid) Nasopharyngeal Swab     Status: None   Collection Time: 03/08/21  2:04 PM   Specimen: Nasopharyngeal Swab; Nasopharyngeal(NP) swabs in vial transport medium  Result Value Ref Range   SARS Coronavirus 2 by RT PCR NEGATIVE NEGATIVE    Comment: (NOTE) SARS-CoV-2 target nucleic acids are NOT DETECTED.  The SARS-CoV-2 RNA is generally detectable in upper respiratory specimens during the acute phase of infection. The lowest concentration of SARS-CoV-2 viral copies this assay can detect is 138 copies/mL. A negative result does not preclude SARS-Cov-2 infection and should not be used as the sole basis for treatment or other patient management decisions. A negative result may occur with  improper specimen collection/handling, submission of specimen other than nasopharyngeal swab, presence of viral mutation(s) within the areas targeted by this assay, and inadequate number of viral copies(<138 copies/mL). A negative result must be combined with clinical observations, patient history, and epidemiological information. The expected result is Negative.  Fact Sheet for Patients:  EntrepreneurPulse.com.au  Fact Sheet for Healthcare Providers:  IncredibleEmployment.be  This test is no t yet approved or cleared by the  [place] FDA and  has been authorized for detection and/or diagnosis of SARS-CoV-2 by FDA under an Emergency Use Authorization (EUA). This EUA will remain  in effect (meaning this test can be used) for the duration of the COVID-19 declaration under Section 564(b)(1) of the Act, 21 U.S.C.section 360bbb-3(b)(1), unless the authorization is terminated  or revoked sooner.       Influenza A by PCR NEGATIVE NEGATIVE   Influenza B by PCR NEGATIVE NEGATIVE    Comment: (NOTE) The Xpert Xpress SARS-CoV-2/FLU/RSV plus assay is intended as an aid in  the diagnosis of influenza from Nasopharyngeal swab specimens and should not be used as a sole basis for treatment. Nasal washings and aspirates are unacceptable for Xpert Xpress SARS-CoV-2/FLU/RSV testing.  Fact Sheet for Patients: EntrepreneurPulse.com.au  Fact Sheet for Healthcare Providers: IncredibleEmployment.be  This test is not yet approved or cleared by the [place] FDA and has been authorized for detection and/or diagnosis of SARS-CoV-2 by FDA under an Emergency Use Authorization (EUA). This EUA will remain in effect (meaning this test can be used) for the duration of the COVID-19 declaration under Section 564(b)(1) of the Act, 21 U.S.C. section 360bbb-3(b)(1), unless the authorization is terminated or revoked.  Performed at [facility], [place] [address] [place], [place] [address]    CT Abdomen Pelvis W Contrast  Result Date: 03/08/2021 CLINICAL DATA:  RLQ pain since 6pm yesterday with tenderness and nausea. EXAM: CT ABDOMEN AND PELVIS WITH CONTRAST TECHNIQUE: Multidetector CT imaging of the abdomen and pelvis was performed using the standard protocol following bolus administration of intravenous contrast. CONTRAST:  93m OMNIPAQUE IOHEXOL 350 MG/ML SOLN COMPARISON:  None. FINDINGS: Lower chest: Minimal atelectasis in the bilateral lung bases. Hepatobiliary: No focal liver  abnormality is seen. No gallstones, gallbladder wall thickening, or biliary dilatation. Pancreas: Unremarkable. No pancreatic ductal dilatation or surrounding inflammatory changes. Spleen: Normal in size without focal abnormality. Adrenals/Urinary Tract: Adrenal glands are unremarkable. There is a tiny hypodensity along the posterior left kidney which is too small to fully characterize, possibly a cyst. No renal calculi or hydronephrosis. Urinary bladder is unremarkable. Stomach/Bowel: Stomach is within normal limits. The appendix is dilated measuring 1.0 cm in diameter (series 5, image 76). There is mild peripancreatic fat stranding. No adjacent free air or fluid to suggest perforation. Vascular/Lymphatic: Aortic atherosclerosis. No enlarged abdominal or pelvic lymph nodes. Reproductive: Prostate is unremarkable. Other: No abdominopelvic ascites. Musculoskeletal: Multilevel degenerative disc disease in the lumbar spine. No acute finding. IMPRESSION: 1. Acute uncomplicated appendicitis. 2. Aortic atherosclerosis. Aortic Atherosclerosis (ICD10-I70.0). Electronically Signed   By: NAudie PintoM.D.   On: 03/08/2021 12:41    Review of Systems  Constitutional:  Positive for appetite change and diaphoresis.  HENT: Negative.    Eyes: Negative.   Respiratory: Negative.    Cardiovascular: Negative.   Gastrointestinal:  Positive for abdominal pain and nausea. Negative for vomiting.  Endocrine: Negative.   Genitourinary: Negative.   Musculoskeletal: Negative.   Skin: Negative.   Allergic/Immunologic: Negative.   Neurological: Negative.   Hematological: Negative.   Psychiatric/Behavioral: Negative.      Physical Exam   Blood pressure 103/63, pulse 74, temperature 99.7 F (37.6 C), temperature source Oral, resp. rate 13, height '5\' 9"'$  (1.753 m), weight 102.1 kg, SpO2 96 %.  CONSTITUTIONAL: no acute distress; conversant; no obvious deformities  EYES: conjunctiva moist; no lid lag; anicteric; pupils  equal bilaterally  NECK: trachea midline; no thyroid nodularity  LUNGS: respiratory effort normal & unlabored; no wheeze; no rales; no tactile fremitus  CV: rate and rhythm regular; no palpable thrills; no murmur; no edema bilat lower extremities  GI: abdomen is soft, obese; tenderness to palpation RLQ with guarding; no mass; no hepatosplenomegaly; no obvious hernia; well healed surgical incision at umbilicus  MSK: normal range of motion of extremities; no clubbing; no cyanosis  PSYCH: appropriate affect for situation; alert and oriented to person, place, & time  LYMPHATIC: no palpable cervical lymphadenopathy; no evidence lymphedema in extremities    Assessment/Plan Acute appendicitis  Admit to general surgery service at  [facility]  IV abx's started in ER  Plan OR for lap appendectomy this afternoon  The risks and benefits of the procedure have been discussed at length with the patient.  The patient understands the proposed procedure, potential alternative treatments, and the course of recovery to be expected.  All of the patient's questions have been answered at this time.  The patient wishes to proceed with surgery.  Armandina Gemma, MD [facility] [place] [facility] Office: [phone]   Armandina Gemma, MD 03/08/2021, 3:23 PM

## 2021-03-08 NOTE — Anesthesia Preprocedure Evaluation (Signed)
Anesthesia Evaluation  Patient identified by MRN, date of birth, ID band Patient awake    Reviewed: Allergy & Precautions, NPO status , Patient's Chart, lab work & pertinent test results  Airway Mallampati: II  TM Distance: >3 FB Neck ROM: Full    Dental no notable dental hx.    Pulmonary neg pulmonary ROS,    Pulmonary exam normal breath sounds clear to auscultation       Cardiovascular negative cardio ROS Normal cardiovascular exam Rhythm:Regular Rate:Normal     Neuro/Psych negative neurological ROS  negative psych ROS   GI/Hepatic negative GI ROS, Neg liver ROS,   Endo/Other  negative endocrine ROS  Renal/GU negative Renal ROS  negative genitourinary   Musculoskeletal negative musculoskeletal ROS (+)   Abdominal   Peds negative pediatric ROS (+)  Hematology negative hematology ROS (+)   Anesthesia Other Findings   Reproductive/Obstetrics negative OB ROS                             Anesthesia Physical Anesthesia Plan  ASA: 2 and emergent  Anesthesia Plan: General   Post-op Pain Management:    Induction: Intravenous and Rapid sequence  PONV Risk Score and Plan: 2 and Ondansetron, Dexamethasone and Treatment may vary due to age or medical condition  Airway Management Planned: Oral ETT  Additional Equipment:   Intra-op Plan:   Post-operative Plan: Extubation in OR  Informed Consent: I have reviewed the patients History and Physical, chart, labs and discussed the procedure including the risks, benefits and alternatives for the proposed anesthesia with the patient or authorized representative who has indicated his/her understanding and acceptance.     Dental advisory given  Plan Discussed with: CRNA and Surgeon  Anesthesia Plan Comments:         Anesthesia Quick Evaluation

## 2021-03-08 NOTE — Anesthesia Procedure Notes (Signed)
Procedure Name: Intubation Date/Time: 03/08/2021 4:00 PM Performed by: Rosaland Lao, CRNA Pre-anesthesia Checklist: Patient identified, Emergency Drugs available, Suction available and Patient being monitored Patient Re-evaluated:Patient Re-evaluated prior to induction Oxygen Delivery Method: Circle system utilized Preoxygenation: Pre-oxygenation with 100% oxygen Induction Type: IV induction and Rapid sequence Ventilation: Oral airway inserted - appropriate to patient size Laryngoscope Size: Glidescope Grade View: Grade I Tube type: Oral Tube size: 7.5 mm Number of attempts: 3 Airway Equipment and Method: Stylet, Oral airway, Video-laryngoscopy and Bougie stylet Placement Confirmation: ETT inserted through vocal cords under direct vision, positive ETCO2 and breath sounds checked- equal and bilateral Secured at: 23 cm Tube secured with: Tape Dental Injury: Teeth and Oropharynx as per pre-operative assessment  Difficulty Due To: Difficulty was unanticipated and Difficult Airway- due to anterior larynx Future Recommendations: Recommend- induction with short-acting agent, and alternative techniques readily available Comments: Grade 3 with Miller 3, Bougie attempted with no success, glidescope used for successful intubation.

## 2021-03-08 NOTE — Op Note (Signed)
OPERATIVE REPORT - LAPAROSCOPIC APPENDECTOMY  Preop diagnosis:  Acute appendicitis  Postop diagnosis:  same  Procedure:  Laparoscopic appendectomy  Surgeon:  Armandina Gemma, MD  Anesthesia:  general endotracheal  Estimated blood loss:  minimal  Preparation:  Chlora-prep  Complications:  none  Indications:  Patient is a 65 year old male who presents to the emergency department with 24-hour history of generalized abdominal pain localizing to the right lower quadrant.  He has had some chills and sweats.  He has had nausea but no emesis.  Patient was evaluated in the emergency department and was noted to have an elevated white blood cell count of 15,000.  CT scan of the abdomen and pelvis was obtained and showed findings consistent with acute appendicitis without complication.  General surgery was called for evaluation and management.  Procedure:  Patient is brought to the operating room and placed in a supine position on the operating room table. Following administration of general anesthesia, a time out was held and the patient's name and procedure is confirmed. Patient is then prepped and draped in the usual strict aseptic fashion.  After ascertaining that an adequate level of anesthesia has been achieved, a peri-umbilical incision is made with a #15 blade. Dissection is carried down to the fascia. Fascia is incised in the midline and the peritoneal cavity is entered cautiously. A #0-vicryl pursestring suture is placed in the fascia. An Hassan cannula is introduced under direct vision and secured with the pursestring suture. The abdomen is insufflated with carbon dioxide. The laparoscope is introduced and the abdomen is explored. Operative ports are placed in the right upper quadrant and left lower quadrant. The appendix is identified. The mesoappendix is divided with the harmonic scalpel. Dissection is carried down to the base of the appendix. The base of the appendix is dissected out clearing  the junction with the cecal wall. Using an Endo-GIA stapler, the base of the appendix is transected at the junction with the cecal wall. There is good approximation of tissue along the staple line. There is good hemostasis along the staple line. The appendix is placed into an endo-catch bag and withdrawn through the umbilical port. The #0-vicryl pursestring suture is tied securely.  Right lower quadrant is irrigated with warm saline which is evacuated. Good hemostasis is noted. Ports are removed under direct vision. Good hemostasis is noted at the port sites. Pneumoperitoneum is released.  Skin incisions are anesthetized with local anesthetic. Wounds are closed with interrupted 4-0 Monocryl subcuticular sutures. Wounds are washed and dried and Dermabond was applied. The patient is awakened from anesthesia and brought to the recovery room. The patient tolerated the procedure well.  Armandina Gemma, MD [facility] Office: [phone]

## 2021-03-08 NOTE — ED Provider Notes (Signed)
[facility] DEPT Provider Note   CSN: [id number] Arrival date & time: 03/08/21  [id number]     History Chief Complaint  Patient presents with   Abdominal Pain    RLQ since 6pm    Robert Simon. is a 65 y.o. male.   Abdominal Pain Associated symptoms: nausea   Associated symptoms: no chest pain, no dysuria, no fever, no hematuria, no shortness of breath and no vomiting    Patient presents with abdominal pain.  The pain started periumbilically last night around 6 PM, its been constant since then but has localized to the right lower quadrant.  There is associated nausea, no emesis.  No fevers at home.  Has not tried any alleviating factors, moving is an aggravating factor.  Pain is been worsening since last night.  He has had a hernia repair, denies any other abdominal surgeries.  Last oral intake of food was last night, patient's last water was at 10:30 AM this morning.  Past Medical History:  Diagnosis Date   Complication of anesthesia    PT STATES ANESTHESIA AS A CHILD MADE HIM VERY HYPER-PT VERY CONCERNED HE WILL GET TOO MUCH ANESTHESIA-PT STATES HE ONLY NEEDS HALF OF WHAT A NORMAL PERSON WOULD GET-BE VERY CAUTIOUS WITH HIS SHOULDERS-PT CONCERNED OF PLACEMENT ON OR TABLE DUE TO HIS BAD SHOULDERS (RIGHT WORSE THAN THE LEFT)   Shoulder pain, bilateral     There are no problems to display for this patient.   Past Surgical History:  Procedure Laterality Date   COLONOSCOPY     HERNIA REPAIR     AGE 66   INGUINAL HERNIA REPAIR Right 04/17/2016   Procedure: HERNIA REPAIR INGUINAL ADULT;  Surgeon: Leonie Green, MD;  Location: [facility] ORS;  Service: General;  Laterality: Right;   UMBILICAL HERNIA REPAIR N/A 04/17/2016   Procedure: HERNIA REPAIR UMBILICAL ADULT;  Surgeon: Leonie Green, MD;  Location: [facility] ORS;  Service: General;  Laterality: N/A;   WISDOM TOOTH EXTRACTION         History reviewed. No pertinent family history.  Social  History   Tobacco Use   Smoking status: Never   Smokeless tobacco: Never  Vaping Use   Vaping Use: Never used  Substance Use Topics   Alcohol use: Yes    Comment: RARE   Drug use: No    Home Medications Prior to Admission medications   Not on File    Allergies    Patient has no known allergies.  Review of Systems   Review of Systems  Constitutional:  Negative for fever.  Respiratory:  Negative for shortness of breath.   Cardiovascular:  Negative for chest pain.  Gastrointestinal:  Positive for abdominal pain and nausea. Negative for vomiting.  Genitourinary:  Negative for dysuria and hematuria.  Musculoskeletal:  Negative for back pain.   Physical Exam Updated Vital Signs BP 107/83 (BP Location: Right Arm)   Pulse (!) 106   Temp 99.7 F (37.6 C) (Oral)   Resp 16   Ht '5\' 9"'$  (1.753 m)   Wt 102.1 kg   SpO2 98%   BMI 33.23 kg/m   Physical Exam Vitals and nursing note reviewed. Exam conducted with a chaperone present.  Constitutional:      General: He is not in acute distress.    Appearance: Normal appearance. He is obese.  HENT:     Head: Normocephalic and atraumatic.  Eyes:     General: No scleral icterus.  Extraocular Movements: Extraocular movements intact.     Pupils: Pupils are equal, round, and reactive to light.  Abdominal:     General: Abdomen is flat. Bowel sounds are normal.     Palpations: Abdomen is soft.     Tenderness: There is abdominal tenderness in the right lower quadrant. There is no right CVA tenderness, left CVA tenderness or guarding.     Comments: Patient with tenderness to the right lower quadrant, no guarding or rebound.  No CVA tenderness.  Abdomen is soft, no rigidity.  He does appear slightly distended, but obese abdomen making it somewhat difficult to differentiate.  Skin:    Coloration: Skin is not jaundiced.  Neurological:     Mental Status: He is alert. Mental status is at baseline.     Coordination: Coordination normal.     ED Results / Procedures / Treatments   Labs (all labs ordered are listed, but only abnormal results are displayed) Labs Reviewed  COMPREHENSIVE METABOLIC PANEL - Abnormal; Notable for the following components:      Result Value   Sodium 133 (*)    Glucose, Bld 142 (*)    All other components within normal limits  CBC - Abnormal; Notable for the following components:   WBC 15.2 (*)    All other components within normal limits  LIPASE, BLOOD  URINALYSIS, ROUTINE W REFLEX MICROSCOPIC    EKG None  Radiology No results found.  Procedures Procedures   Medications Ordered in ED Medications - No data to display  ED Course  I have reviewed the triage vital signs and the nursing notes.  Pertinent labs & imaging results that were available during my care of the patient were reviewed by me and considered in my medical decision making (see chart for details).    MDM Rules/Calculators/A&P                           Patient vitals stable, CT scan is notable for acute uncomplicated appendicitis.  Patient has been given Flagyl and Rocephin.  Last meal was last night, last fluid intake was at 1030 this morning.  Patient is on any blood thinners, I spoke with general surgery Dr. Armandina Gemma who will come see the patient.  Final Clinical Impression(s) / ED Diagnoses Final diagnoses:  None    Rx / DC Orders ED Discharge Orders     None        Sherrill Raring, PA-C 03/08/21 [place], [place], DO 03/08/21 1857

## 2021-03-09 ENCOUNTER — Encounter (HOSPITAL_COMMUNITY): Payer: Self-pay | Admitting: Surgery

## 2021-03-09 MED ORDER — TRAMADOL HCL 50 MG PO TABS: 50.0000 mg | ORAL_TABLET | Freq: Four times a day (QID) | ORAL | 0 refills | Status: AC | PRN

## 2021-03-09 NOTE — Plan of Care (Signed)
  Problem: Education: Goal: Knowledge of General Education information will improve Description: Including pain rating scale, medication(s)/side effects and non-pharmacologic comfort measures Outcome: Adequate for Discharge   Problem: Health Behavior/Discharge Planning: Goal: Ability to manage health-related needs will improve Outcome: Adequate for Discharge   Problem: Clinical Measurements: Goal: Ability to maintain clinical measurements within normal limits will improve Outcome: Adequate for Discharge Goal: Will remain free from infection Outcome: Adequate for Discharge Goal: Diagnostic test results will improve Outcome: Adequate for Discharge Goal: Respiratory complications will improve Outcome: Adequate for Discharge Goal: Cardiovascular complication will be avoided Outcome: Adequate for Discharge   Problem: Activity: Goal: Risk for activity intolerance will decrease Outcome: Adequate for Discharge   Problem: Nutrition: Goal: Adequate nutrition will be maintained Outcome: Adequate for Discharge   Problem: Coping: Goal: Level of anxiety will decrease Outcome: Adequate for Discharge   Problem: Elimination: Goal: Will not experience complications related to bowel motility Outcome: Adequate for Discharge Goal: Will not experience complications related to urinary retention Outcome: Adequate for Discharge   Problem: Pain Managment: Goal: General experience of comfort will improve Outcome: Adequate for Discharge   Problem: Safety: Goal: Ability to remain free from injury will improve Outcome: Adequate for Discharge   Problem: Skin Integrity: Goal: Risk for impaired skin integrity will decrease Outcome: Adequate for Discharge   Problem: Education: Goal: Required Educational Video(s) Outcome: Adequate for Discharge   Problem: Clinical Measurements: Goal: Postoperative complications will be avoided or minimized Outcome: Adequate for Discharge

## 2021-03-09 NOTE — Progress Notes (Signed)
Assessment unchanged. Pt verbalized understanding of dc instructions through teach back. Discharged via foot per request accompanied by significant other to front entrance.

## 2021-03-09 NOTE — Discharge Summary (Signed)
    Physician Discharge Summary   Patient ID: Robert Simon. MRN: [id number] DOB/AGE: 1956/04/19 65 y.o.  Admit date: 03/08/2021  Discharge date: 03/09/2021  Discharge Diagnoses:  Principal Problem:   Appendicitis, acute Active Problems:   Acute appendicitis   Discharged Condition: good  Hospital Course: Patient was admitted for observation following lap appendectomy surgery.  Post op course was uncomplicated.  Pain was well controlled.  Tolerated diet. Patient was prepared for discharge home on POD#1.   Consults: None  Treatments: surgery: lap appendectomy  Discharge Exam: Blood pressure 108/66, pulse 63, temperature 98.5 F (36.9 C), temperature source Oral, resp. rate 18, height '5\' 9"'$  (1.753 m), weight 102.1 kg, SpO2 94 %. HEENT - clear Neck - soft Chest - clear bilaterally Cor - RRR Abd - wounds dry and intact; minimal tenderness  Disposition: Home  Discharge Instructions     Diet - low sodium heart healthy   Complete by: As directed    Increase activity slowly   Complete by: As directed    No dressing needed   Complete by: As directed       Allergies as of 03/09/2021   No Known Allergies      Medication List     TAKE these medications    traMADol 50 MG tablet Commonly known as: Ultram Take 1-2 tablets (50-100 mg total) by mouth every 6 (six) hours as needed for moderate pain.               Discharge Care Instructions  (From admission, onward)           Start     Ordered   03/09/21 0000  No dressing needed        03/09/21 0818            Follow-up Information     Armandina Gemma, MD. Schedule an appointment as soon as possible for a visit in 3 week(s).   Specialty: General Surgery Why: For wound re-check Contact information: [place] [address] [phone]                 Zaylee Cornia, [facility] Office: [phone]   Signed: Armandina Gemma 03/09/2021, 8:19 AM

## 2021-03-09 NOTE — Discharge Instructions (Signed)
[facility]  LAPAROSCOPIC SURGERY:  POST-OP INSTRUCTIONS  Always review your discharge instruction sheet given to you by the facility where your surgery was performed.  A prescription for pain medication may be given to you upon discharge.  Take your pain medication as prescribed.  If narcotic pain medicine is not needed, then you may take acetaminophen (Tylenol) or ibuprofen (Advil) as needed.  Take your usually prescribed medications unless otherwise directed.  If you need a refill on your pain medication, please contact your pharmacy.  They will contact our office to request authorization. Prescriptions will not be filled after 5 P.M. or on weekends.  You should follow a light diet the first few days after arrival home, such as soup and crackers or toast.  Be sure to include plenty of fluids daily.  Most patients will experience some swelling and bruising in the area of the incisions.  Ice packs will help.  Swelling and bruising can take several days to resolve.   It is common to experience some constipation after surgery.  Increasing fluid intake and taking a stool softener (such as Colace) will usually help or prevent this problem from occurring.  A mild laxative (Milk of Magnesia or Miralax) should be taken according to package instructions if there has been no bowel movement after 48 hours.  You will likely have Dermabond (topical glue) over your incisions.  This seals the incisions and allows you to bathe and shower at any time after your surgery.  Glue should remain in place for up to 10 days.  It may be removed after 10 days by pealing off the Dermabond material or using Vaseline or naval jelly to remove.  If you have steri-strips over your incisions, you may remove the gauze bandage on the second day after surgery, and you may shower at that time.  Leave your steri-strips (small skin tapes) in place directly over the incision.  These strips should remain on the  skin for 5-7 days and then be removed.  You may get them wet in the shower and pat them dry.  Any sutures or staples will be removed at the office during your follow-up visit.  ACTIVITIES:  You may resume regular (light) daily activities beginning the next day - such as daily self-care, walking, climbing stairs - gradually increasing activities as tolerated.  You may have sexual intercourse when it is comfortable.  Refrain from any heavy lifting or straining until approved by your doctor.  You may drive when you are no longer taking prescription pain medication, when you can comfortably wear a seatbelt, and when you can safely maneuver your car and apply brakes.  You should see your doctor in the office for a follow-up appointment approximately 2-3 weeks after your surgery.  Make sure that you call for this appointment within a day or two after you arrive home to insure a convenient appointment time.  WHEN TO CALL YOUR DOCTOR: Fever over 101.0 Inability to urinate Continued bleeding from incision Increased pain, redness, or drainage from the incision Increasing abdominal pain  The clinic staff is available to answer your questions during regular business hours.  Please don't hesitate to call and ask to speak to one of the nurses for clinical concerns.  If you have a medical emergency, go to the nearest emergency room or call 911.  A surgeon from [facility] is always on call for the hospital.  Jacquan Savas, MD [facility] Office: [phone] Toll Free:    [phone] FAX [phone]  Website: [email or web address] 

## 2021-03-12 LAB — SURGICAL PATHOLOGY

## 2021-03-18 ENCOUNTER — Encounter (HOSPITAL_COMMUNITY): Payer: Self-pay | Admitting: Surgery

## 2021-03-18 MED ORDER — LACTATED RINGERS IR SOLN
Status: AC | PRN
  Administered 2021-03-08: 1000 mL

## 2021-03-18 MED ORDER — LACTATED RINGERS IV SOLN: INTRAVENOUS | Status: AC | PRN

## 2021-07-30 ENCOUNTER — Encounter (INDEPENDENT_AMBULATORY_CARE_PROVIDER_SITE_OTHER): Payer: Medicare Other | Admitting: Ophthalmology

## 2021-08-04 ENCOUNTER — Other Ambulatory Visit: Payer: Self-pay

## 2021-08-04 ENCOUNTER — Encounter (INDEPENDENT_AMBULATORY_CARE_PROVIDER_SITE_OTHER): Payer: Medicare Other | Admitting: Ophthalmology

## 2021-08-04 DIAGNOSIS — H33303 Unspecified retinal break, bilateral: Secondary | ICD-10-CM

## 2021-08-04 DIAGNOSIS — H43813 Vitreous degeneration, bilateral: Secondary | ICD-10-CM | POA: Diagnosis not present

## 2021-08-04 DIAGNOSIS — H40013 Open angle with borderline findings, low risk, bilateral: Secondary | ICD-10-CM | POA: Diagnosis not present

## 2021-08-04 DIAGNOSIS — H2513 Age-related nuclear cataract, bilateral: Secondary | ICD-10-CM

## 2022-08-01 IMAGING — CT CT ABD-PELV W/ CM
2 of 5 series · 16 of 46 positions shown, 18 images · IV contrast (omnipaque)
Comparison: None.

CLINICAL DATA: RLQ pain since 6pm yesterday with tenderness and
nausea.

EXAM:
CT ABDOMEN AND PELVIS WITH CONTRAST
TECHNIQUE: Multidetector CT imaging of the abdomen and pelvis was performed
using the standard protocol following bolus administration of
intravenous contrast.
CONTRAST:  80mL OMNIPAQUE IOHEXOL 350 MG/ML SOLN

[Series 2: axial st · axial · 0.90mm/px · z∈[+1010,+1475]mm · 13 of 109 slices shown, 15 images]
[im 8/109  soft-tissue]
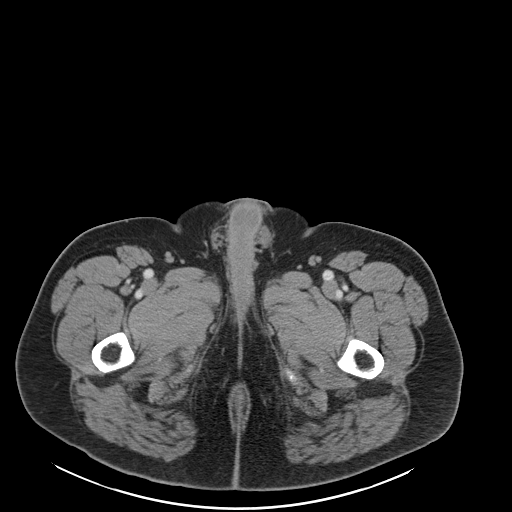
[im 8/109  bone]
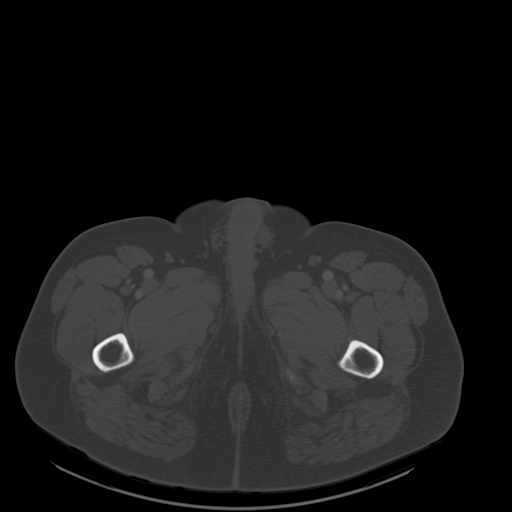
[im 16/109  soft-tissue]
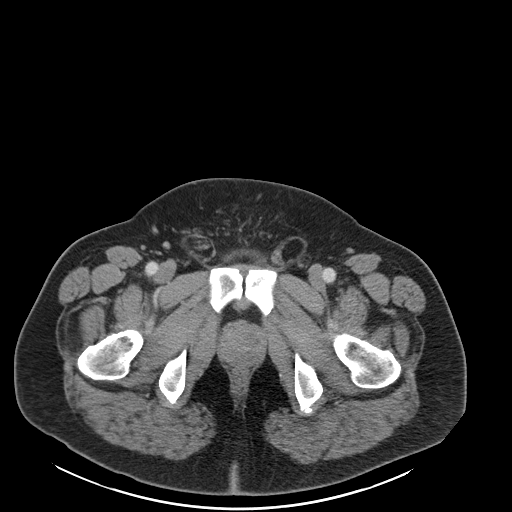
[im 24/109  soft-tissue]
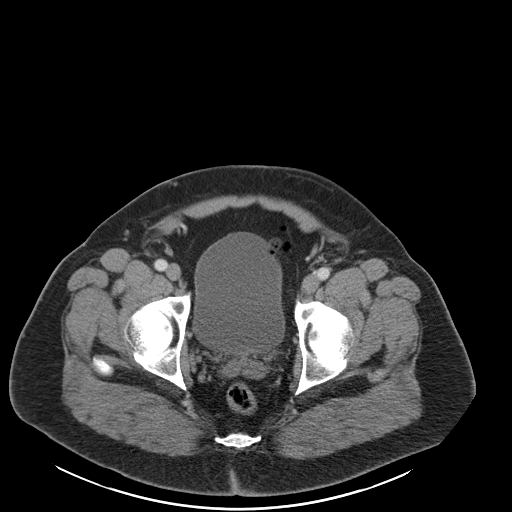
[im 31/109  soft-tissue]
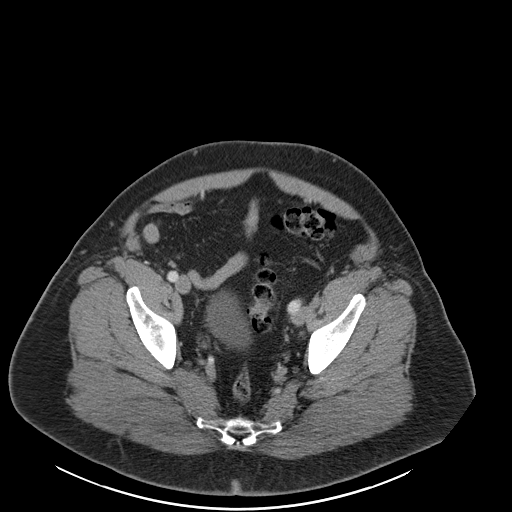
[im 39/109  soft-tissue]
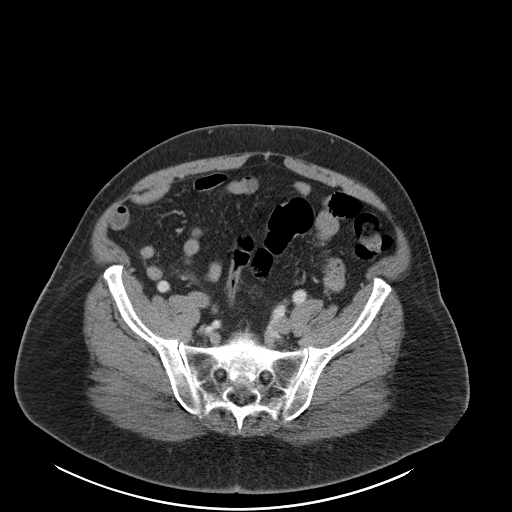
[im 47/109  soft-tissue]
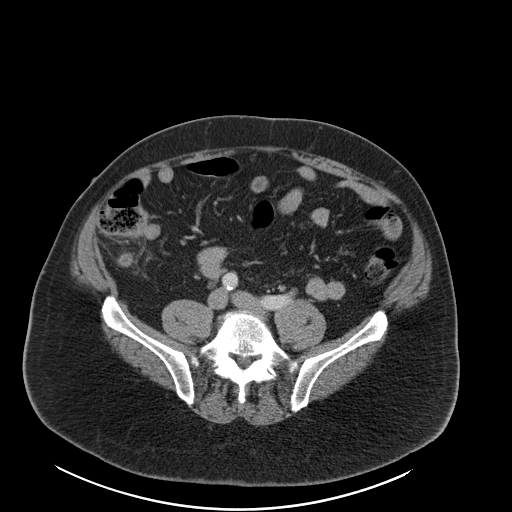
[im 55/109  soft-tissue]
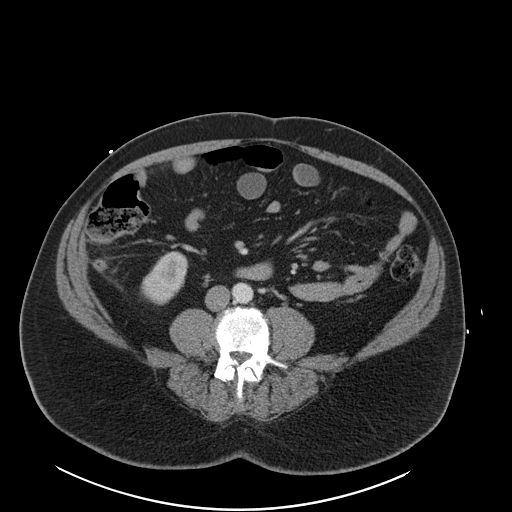
[im 62/109  soft-tissue]
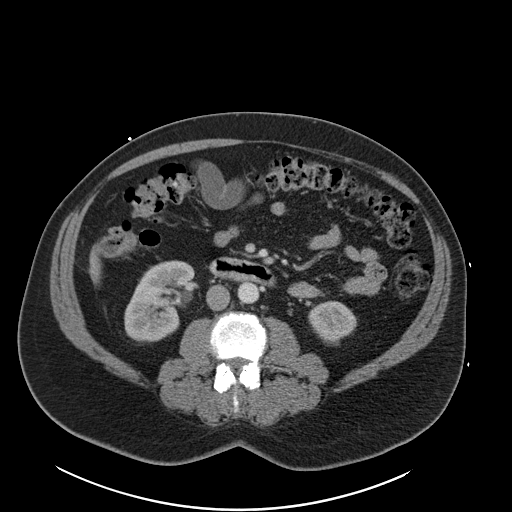
[im 70/109  soft-tissue]
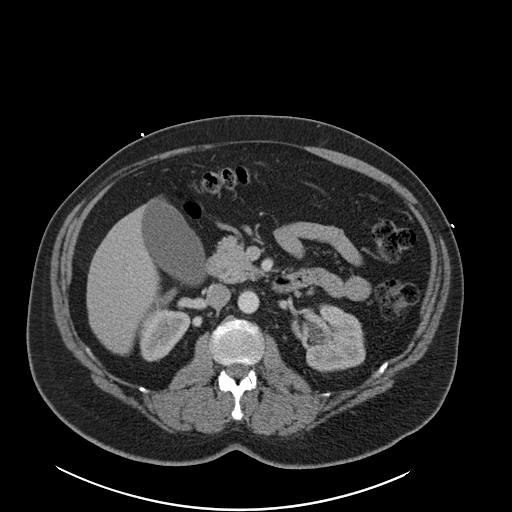
[im 70/109  bone]
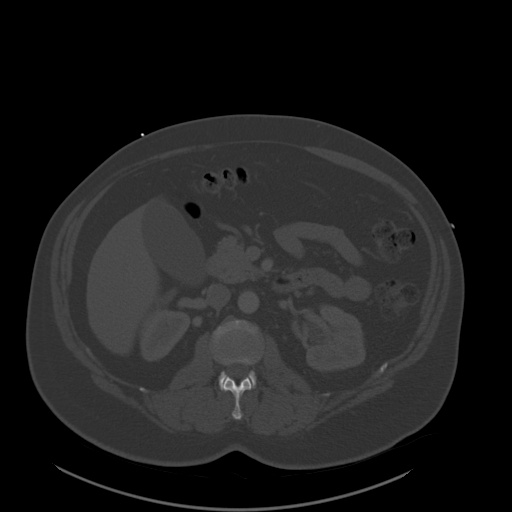
[im 78/109  soft-tissue]
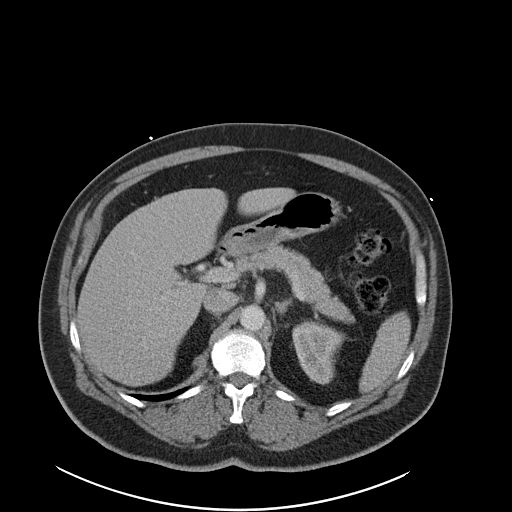
[im 85/109  soft-tissue]
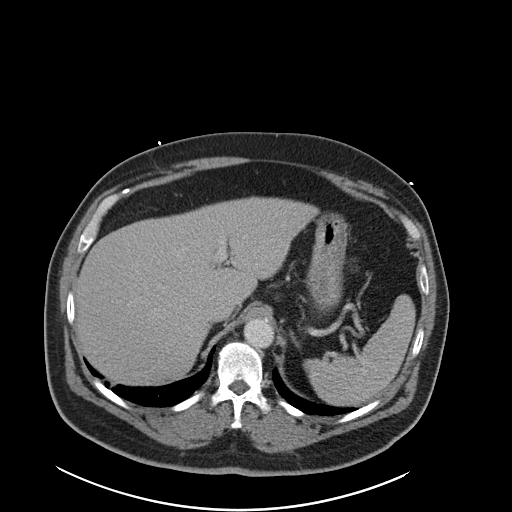
[im 93/109  soft-tissue]
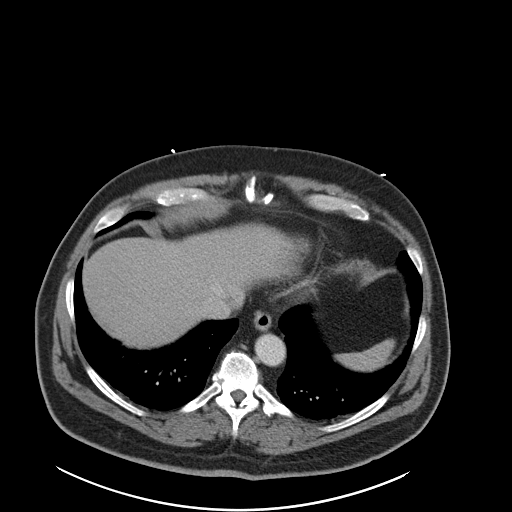
[im 101/109  soft-tissue]
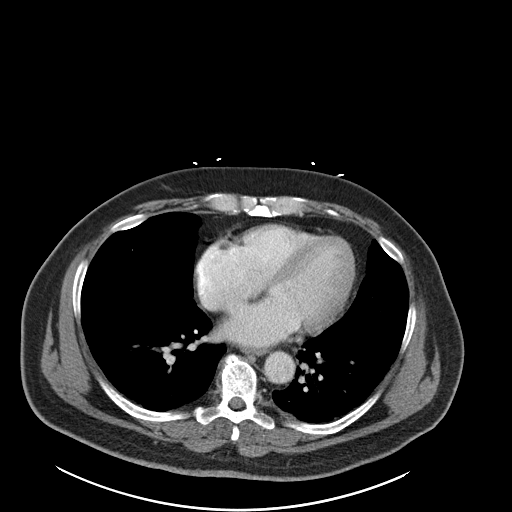

[Series 5: coronal st · coronal · 1.03mm/px · 3 of 176 slices shown]
[im 59/176  soft-tissue]
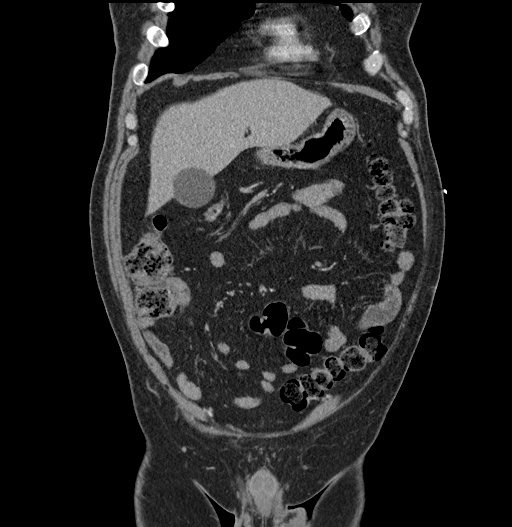
[im 78/176  soft-tissue]
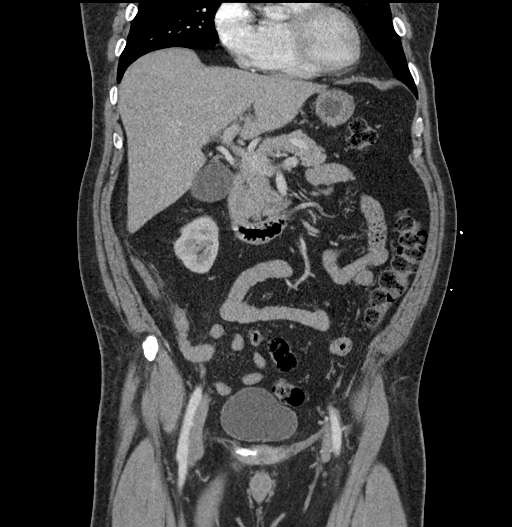
[im 98/176  soft-tissue]
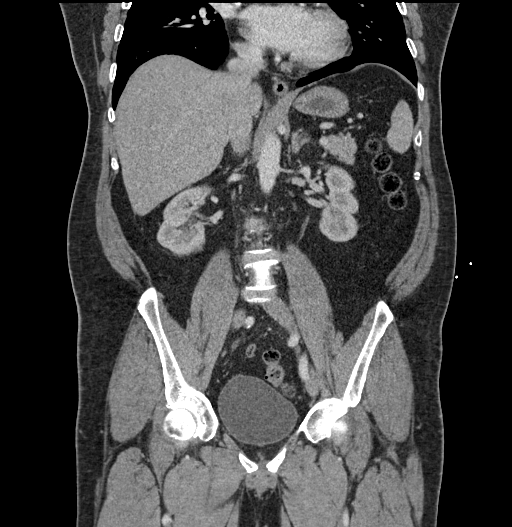

[16 of 46 positions shown; findings below may reference images not displayed]

FINDINGS: Lower chest: Minimal atelectasis in the bilateral lung bases.

Hepatobiliary: No focal liver abnormality is seen. No gallstones,
gallbladder wall thickening, or biliary dilatation.

Pancreas: Unremarkable. No pancreatic ductal dilatation or
surrounding inflammatory changes.

Spleen: Normal in size without focal abnormality.

Adrenals/Urinary Tract: Adrenal glands are unremarkable. There is a
tiny hypodensity along the posterior left kidney which is too small
to fully characterize, possibly a cyst. No renal calculi or
hydronephrosis. Urinary bladder is unremarkable.

Stomach/Bowel: Stomach is within normal limits. The appendix is
dilated measuring 1.0 cm in diameter (series 5, image 76). There is
mild peripancreatic fat stranding. No adjacent free air or fluid to
suggest perforation.

Vascular/Lymphatic: Aortic atherosclerosis. No enlarged abdominal or
pelvic lymph nodes.

Reproductive: Prostate is unremarkable.

Other: No abdominopelvic ascites.

Musculoskeletal: Multilevel degenerative disc disease in the lumbar
spine. No acute finding.
IMPRESSION: 1. Acute uncomplicated appendicitis.
2. Aortic atherosclerosis.

Aortic Atherosclerosis (3D363-85V.V).

## 2022-08-05 ENCOUNTER — Encounter (INDEPENDENT_AMBULATORY_CARE_PROVIDER_SITE_OTHER): Payer: Medicare Other | Admitting: Ophthalmology

## 2022-09-07 ENCOUNTER — Encounter (INDEPENDENT_AMBULATORY_CARE_PROVIDER_SITE_OTHER): Payer: Medicare Other | Admitting: Ophthalmology

## 2022-10-27 ENCOUNTER — Encounter (INDEPENDENT_AMBULATORY_CARE_PROVIDER_SITE_OTHER): Payer: Medicare Other | Admitting: Ophthalmology

## 2022-10-27 DIAGNOSIS — H43813 Vitreous degeneration, bilateral: Secondary | ICD-10-CM | POA: Diagnosis not present

## 2022-10-27 DIAGNOSIS — H33303 Unspecified retinal break, bilateral: Secondary | ICD-10-CM | POA: Diagnosis not present

## 2023-08-18 ENCOUNTER — Other Ambulatory Visit: Payer: Self-pay | Admitting: Student

## 2023-08-18 DIAGNOSIS — R1011 Right upper quadrant pain: Secondary | ICD-10-CM

## 2023-08-20 ENCOUNTER — Ambulatory Visit
Admission: RE | Admit: 2023-08-20 | Discharge: 2023-08-20 | Disposition: A | Discharge status: Home / Self Care | Payer: Medicare Other | Source: Ambulatory Visit | Attending: Student | Admitting: Student

## 2023-08-20 DIAGNOSIS — R1011 Right upper quadrant pain: Secondary | ICD-10-CM

## 2023-10-28 ENCOUNTER — Encounter (INDEPENDENT_AMBULATORY_CARE_PROVIDER_SITE_OTHER): Payer: Medicare Other | Admitting: Ophthalmology

## 2023-10-28 DIAGNOSIS — H33303 Unspecified retinal break, bilateral: Secondary | ICD-10-CM

## 2023-10-28 DIAGNOSIS — H43813 Vitreous degeneration, bilateral: Secondary | ICD-10-CM

## 2024-05-18 ENCOUNTER — Encounter (INDEPENDENT_AMBULATORY_CARE_PROVIDER_SITE_OTHER): Admitting: Ophthalmology

## 2024-05-18 DIAGNOSIS — H43813 Vitreous degeneration, bilateral: Secondary | ICD-10-CM

## 2024-05-18 DIAGNOSIS — H33303 Unspecified retinal break, bilateral: Secondary | ICD-10-CM | POA: Diagnosis not present

## 2024-07-10 ENCOUNTER — Ambulatory Visit: Admitting: Family Medicine

## 2024-07-10 DIAGNOSIS — Z113 Encounter for screening for infections with a predominantly sexual mode of transmission: Secondary | ICD-10-CM

## 2024-07-10 DIAGNOSIS — Z202 Contact with and (suspected) exposure to infections with a predominantly sexual mode of transmission: Secondary | ICD-10-CM

## 2024-07-10 LAB — HM HEPATITIS C SCREENING LAB: HM Hepatitis Screen: NEGATIVE

## 2024-07-10 LAB — HM HIV SCREENING LAB: HM HIV Screening: NEGATIVE

## 2024-07-10 MED ORDER — DOXYCYCLINE HYCLATE 100 MG PO TABS: 100.0000 mg | ORAL_TABLET | Freq: Two times a day (BID) | ORAL | Status: AC

## 2024-07-10 NOTE — Progress Notes (Signed)
 Pt here for STI screening and as possible contact to syphilis.  No in house labs performed today.  Doxycycline  100mg  #28 dispensed to patient.  Condoms declined.-Renan Danese, RN

## 2024-07-10 NOTE — Progress Notes (Signed)
 " [facility] Department STI clinic [address] [place] [address] Main phone: [phone]  STI screening visit  Subjective:  Robert Matt. is a 69 y.o. male being seen today for an STI screening visit. The patient reports they do have symptoms.    Patient has the following medical conditions:  Patient Active Problem List   Diagnosis Date Noted   Appendicitis, acute 03/08/2021   Acute appendicitis 03/08/2021   Chief Complaint  Patient presents with   SEXUALLY TRANSMITTED DISEASE    STI screening-exposed to syphilis-rash on back approx 2 weeks ago-thinks had syphilis at age 16    HPI Patient reports to clinic with a 2 week hx of rash. Reports he had sex with someone who is from [place] and they told him he has syphilis. 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 No other hx of STDs  Reproductive considerations: Does the patient or their partner desires a pregnancy in the next year? No  See flowsheet for further details and programmatic requirements  Hyperlink available at the top of the signed note in blue.  Flow sheet content below:  Pregnancy Intention Screening Does the patient want to become pregnant in the next year?: No Does the patient's partner want to become pregnant in the next year?: No Would the patient like to discuss contraceptive options today?: No All Patients Anyone smoke around pt and/or pt's children?: No Anyone smoke inside pt's house?: No Anyone smoke inside car?: No Anyone smoke inside the workplace?: No Reason For STD Screen STD Screening: Has symptoms Have you ever had an STD?: Yes History of Antibiotic use in the past 2 weeks?: No STD Symptoms Rash: Yes Risk Factors for Hep B Household, sexual, or needle sharing contact of a person infected with Hep B: No Sexual contact with a person who uses drugs not as prescribed?: No Currently or Ever used drugs not as prescribed: No HIV Positive: No PRep Patient:  No Men who have sex with men: Yes Have Hepatitis C: No History of Incarceration: No History of Homeslessness?: No Anal sex following anal drug use?: No Risk Factors for Hep C Currently using drugs not as prescribed: No Sexual partner(s) currently using drugs as not prescribed: No History of drug use: No HIV Positive: No People with a history of incarceration: No People born between the years of 30 and 46: Yes Hepatitis Counseling Hep B Counseling: Patient accepts testing for Hep B today, Counseled patient about increased risk of Hep B and recommendation for testing Hep C Counseling: Patient accepts testing for Hep C today Abuse History Has patient ever been abused physically?: No Has patient ever been abused sexually?: No Does patient feel they have a problem with Anxiety?: No Does patient feel they have a problem with Depression?: No Counseling Patient counseled to use condoms with all sex: Condoms declined RTC in 2-3 weeks for test results: Yes Clinic will call if test results abnormal before test result appt.: Yes Test results given to patient Patient counseled to use condoms with all sex: Condoms declined  Screening for MPX risk:  Unexplained rash?  Yes   MSM?  Yes   Multiple or anonymous sex partners?  No   Any close or sexual contact with a person  diagnosed with MPX?  No   Any outside the [place]  where MPX is endemic?  No   High clinical suspicion for MPX?    -Unlikely to be chickenpox    -Lymphadenopathy    -  Rash that presents in same phase of       evolution on any given body part  No   Does this patient meet CDC recommendations for vaccination against MPOX? No  You already have or anticipate having the following risks:  Your sex partner has the following risks: You're traveling to a county with a clade I MPOX outbreak and anticipate these risks: Occupational exposure  You had known or suspected exposure to someone with monkeypox You had a sex partner in the past  2 weeks who was diagnosed with monkeypox You are a gay, bisexual, or other man who has sex with men, or are transgender or nonbinary and in the past 6 months have had any of the following: - A new diagnosis of one or more sexually transmitted diseases (e.g., chlamydia, gonorrhea, or syphilis) - More than one sex partner You have had any of the following in the past 6 months: - Sex at a commercial sex venue (like a sex club or bathhouse) - Sex related to a large commercial event   or in a geographic area (city or county for example) where mpox virus transmission is occurring Sex with a new partner Sex at a commercial sex venue (e.g., a sex club or bathhouse) Sex in it consultant for money, goods, drugs, or other trade Sex in association with a large public event (e.g., a rave, party, or festival) i.e. certain people who work in a laboratory or healthcare facility   Infectious disease screenings: Vaccinated against HPV? Unknown  HIV Ever had a positive? Unknown Last test: unknown Results in chart:  No results found for: HMHIVSCREEN No results found for: HIV   Hep B Hep B status: unknown or no prior testing Received HBV vaccination? Unknown Received HBV testing for immunity? Unknown Results in chart:  No components found for: HMHEPBSCREEN  Do they qualify for HBV screening today? Yes and and accepts testing today Criteria:  -Household, sexual or needle sharing contact with HBV -History of drug use or homelessness -HIV positive -Those with known Hep C  Hep C Hep C status: unknown or no prior testing Results in chart:  No results found for: HMHEPCSCREEN No components found for: HEPC  Do they qualify for HCV screening today? Yes and and accepts testing today Criteria - since the last HCV result, does the patient have any of the following? - Current drug use - Have a partner with drug use - Has been incarcerated  Immunization history:  Immunization History  Administered  Date(s) Administered   PFIZER(Purple Top)SARS-COV-2 Vaccination 07/30/2019, 08/21/2019, 02/23/2020    The following portions of the patient's history were reviewed and updated as appropriate: allergies, current medications, past medical history, past social history, past surgical history and problem list.  Substance use screenings:  Uses tobacco products? No Uses vapes? No Uses alcohol? No Uses non-injectable substances that alter your mental status? No Uses non-prescribed injectable substances? No  Immunization History  Administered Date(s) Administered   PFIZER(Purple Top)SARS-COV-2 Vaccination 07/30/2019, 08/21/2019, 02/23/2020    The following portions of the patient's history were reviewed and updated as appropriate: allergies, current medications, past medical history, past social history, past surgical history and problem list.  Objective:  There were no vitals filed for this visit.  Physical Exam Exam conducted with a chaperone present Robert Orange CNA).  Constitutional:      Appearance: Normal appearance.  HENT:     Head: Normocephalic and atraumatic.     Comments: No nits or hair loss on  scalp, brows, or lashes    Mouth/Throat:     Mouth: Mucous membranes are moist. No oral lesions.     Pharynx: Oropharynx is clear. No oropharyngeal exudate or posterior oropharyngeal erythema.  Eyes:     General:        Right eye: No discharge.        Left eye: No discharge.     Conjunctiva/sclera: Conjunctivae normal.     Right eye: Right conjunctiva is not injected. No exudate.    Left eye: Left conjunctiva is not injected. No exudate. Pulmonary:     Effort: Pulmonary effort is normal.  Abdominal:     Palpations: There is no hepatomegaly.     Tenderness: There is no abdominal tenderness. There is no rebound.  Genitourinary:    Pubic Area: No rash or pubic lice (no nits).      Penis: Normal. No discharge or lesions.      Testes: Normal.     Epididymis:     Right: Normal. No  mass or tenderness.     Left: Normal. No mass or tenderness.     Rectum: Tenderness: no lesions or discharge.     Comments: Penile Discharge Amount: none Color:  none Lymphadenopathy:     Cervical: No cervical adenopathy.     Upper Body:     Right upper body: No supraclavicular, axillary or epitrochlear adenopathy.     Left upper body: No supraclavicular, axillary or epitrochlear adenopathy.     Lower Body: No right inguinal adenopathy. No left inguinal adenopathy.  Skin:    General: Skin is warm and dry.     Findings: Rash present. No lesion. Rash is macular.         Comments: Generalized rash covering the back - erythematous   Neurological:     Mental Status: He is alert and oriented to person, place, and time.      Assessment and Plan:  Robert Simon. is a 69 y.o. male presenting to the [facility] Department for STI screening.  Patient accepted the following screenings: oral GC culture, urine CT/GC, HIV, RPR, Hep B, and Hep C  1. Screening for venereal disease (Primary)  - Chlamydia/Gonorrhea [facility] - HBV Antigen/Antibody [facility] - HIV/HCV [facility] - Syphilis Serology, [facility] - Chlamydia/GC NAA, Confirmation  2. Exposure to syphilis -pt reports exposure to syphilis from pt that is from [place]. Unclear if their partner was tested and treated in [place] vs [place] .  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    Counseling: Recommended condom use with all sex Discussed importance of condom use for STI prevention Discussed time line for [facility] results and that patient will be called with positive results and encouraged patient to call if they had not heard in 2 weeks Recommended repeat testing in 3 months with positive results. Recommended returning for continued or worsening symptoms.  Return in about 3 months (around 10/08/2024) for STI screening.  Future Appointments  Date Time Provider Department Center  05/18/2025 12:15 PM Alvia Norleen BIRCH, MD [facility] None    Verneta Bers, FNP "

## 2024-07-13 LAB — CHLAMYDIA/GC NAA, CONFIRMATION
Chlamydia trachomatis, NAA: NEGATIVE
Neisseria gonorrhoeae, NAA: NEGATIVE

## 2024-07-18 LAB — HBV ANTIGEN/ANTIBODY STATE LAB
Hep B Core Total Ab: NONREACTIVE
Hep B S Ab: NONREACTIVE
Hepatitis B Surface Ag: NONREACTIVE

## 2024-10-26 ENCOUNTER — Encounter (INDEPENDENT_AMBULATORY_CARE_PROVIDER_SITE_OTHER): Admitting: Ophthalmology

## 2025-05-18 ENCOUNTER — Encounter (INDEPENDENT_AMBULATORY_CARE_PROVIDER_SITE_OTHER): Admitting: Ophthalmology
